# Patient Record
Sex: Female | Born: 1980 | Race: White | Hispanic: No | Marital: Single | Smoking: Current some day smoker
Health system: Southern US, Community
[De-identification: ages and names within clinical notes are randomized; demographics above are authoritative.]

## PROBLEM LIST (undated history)

## (undated) DIAGNOSIS — Z8619 Personal history of other infectious and parasitic diseases: Secondary | ICD-10-CM

## (undated) DIAGNOSIS — R51 Headache: Secondary | ICD-10-CM

## (undated) DIAGNOSIS — Z789 Other specified health status: Secondary | ICD-10-CM

## (undated) HISTORY — PX: NO PAST SURGERIES: SHX2092

## (undated) HISTORY — DX: Personal history of other infectious and parasitic diseases: Z86.19

## (undated) HISTORY — PX: MYRINGOTOMY: SUR874

---

## 2004-12-03 ENCOUNTER — Emergency Department (HOSPITAL_COMMUNITY): Admission: EM | Admit: 2004-12-03 | Discharge: 2004-12-03 | Payer: Self-pay | Admitting: Cardiology

## 2005-10-25 ENCOUNTER — Emergency Department (HOSPITAL_COMMUNITY): Admission: EM | Admit: 2005-10-25 | Discharge: 2005-10-25 | Payer: Self-pay | Admitting: Emergency Medicine

## 2007-05-22 IMAGING — CR DG FOOT COMPLETE 3+V*R*
3 series · 3 of 3 positions shown · non-contrast
Comparison: none

CLINICAL DATA: Pain and swelling laterally in foot and ankle

RIGHT FOOT - 3  VIEW

[t foot ap right]
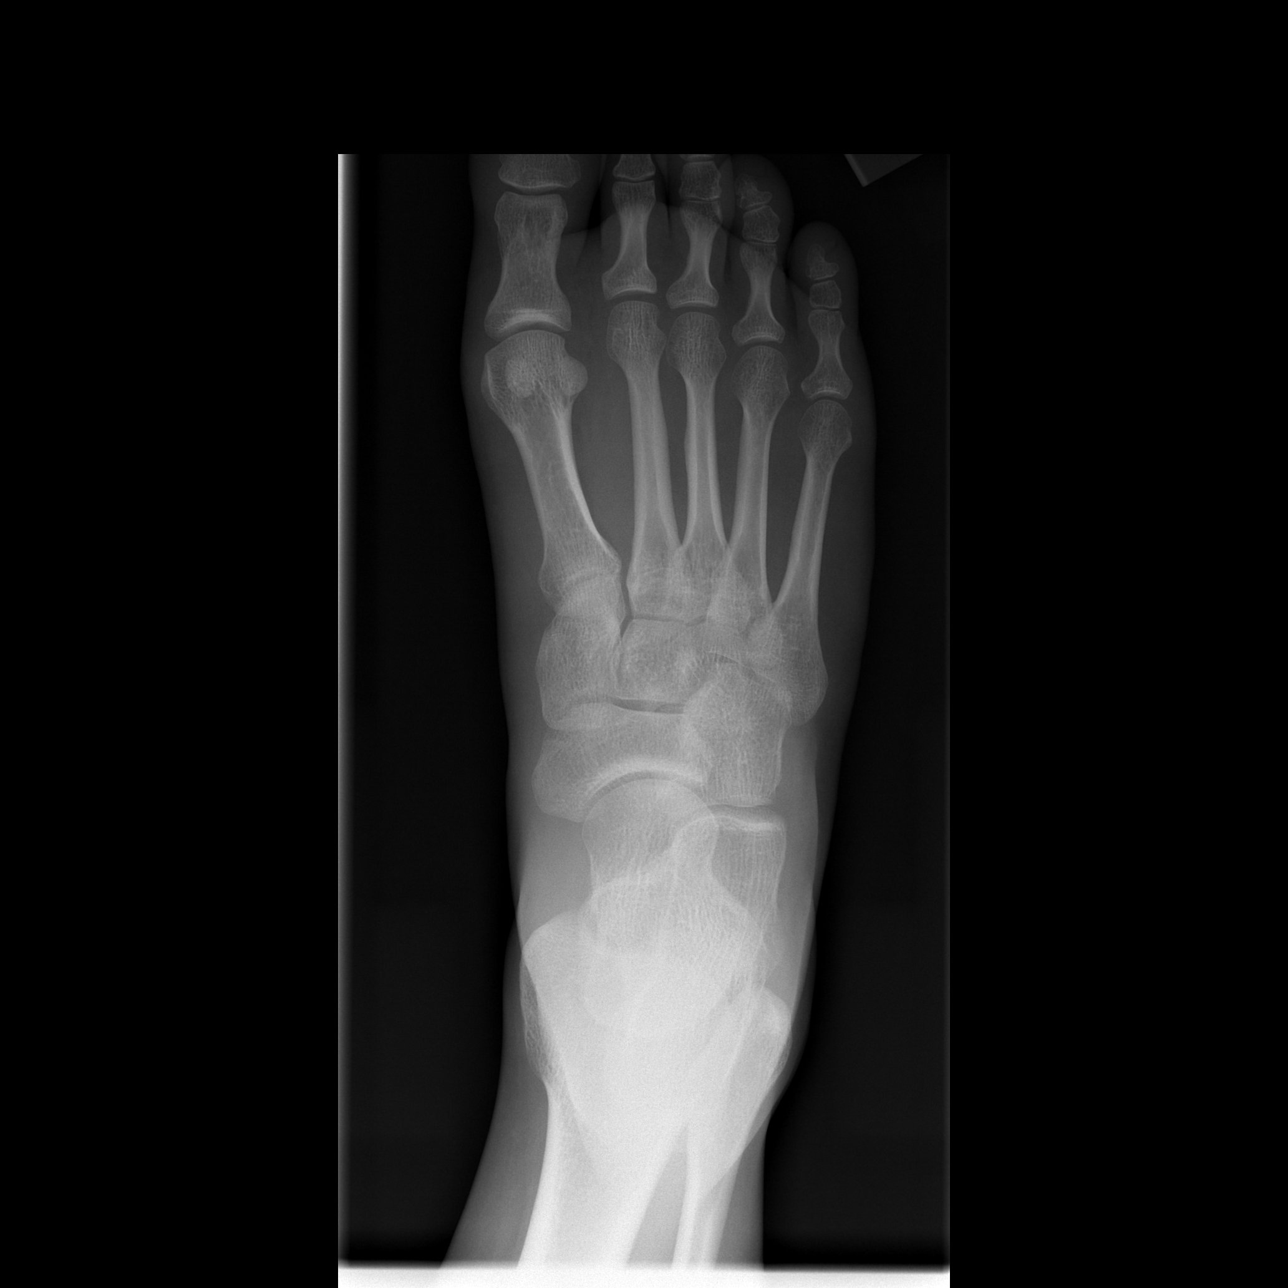

[t foot oblique right]
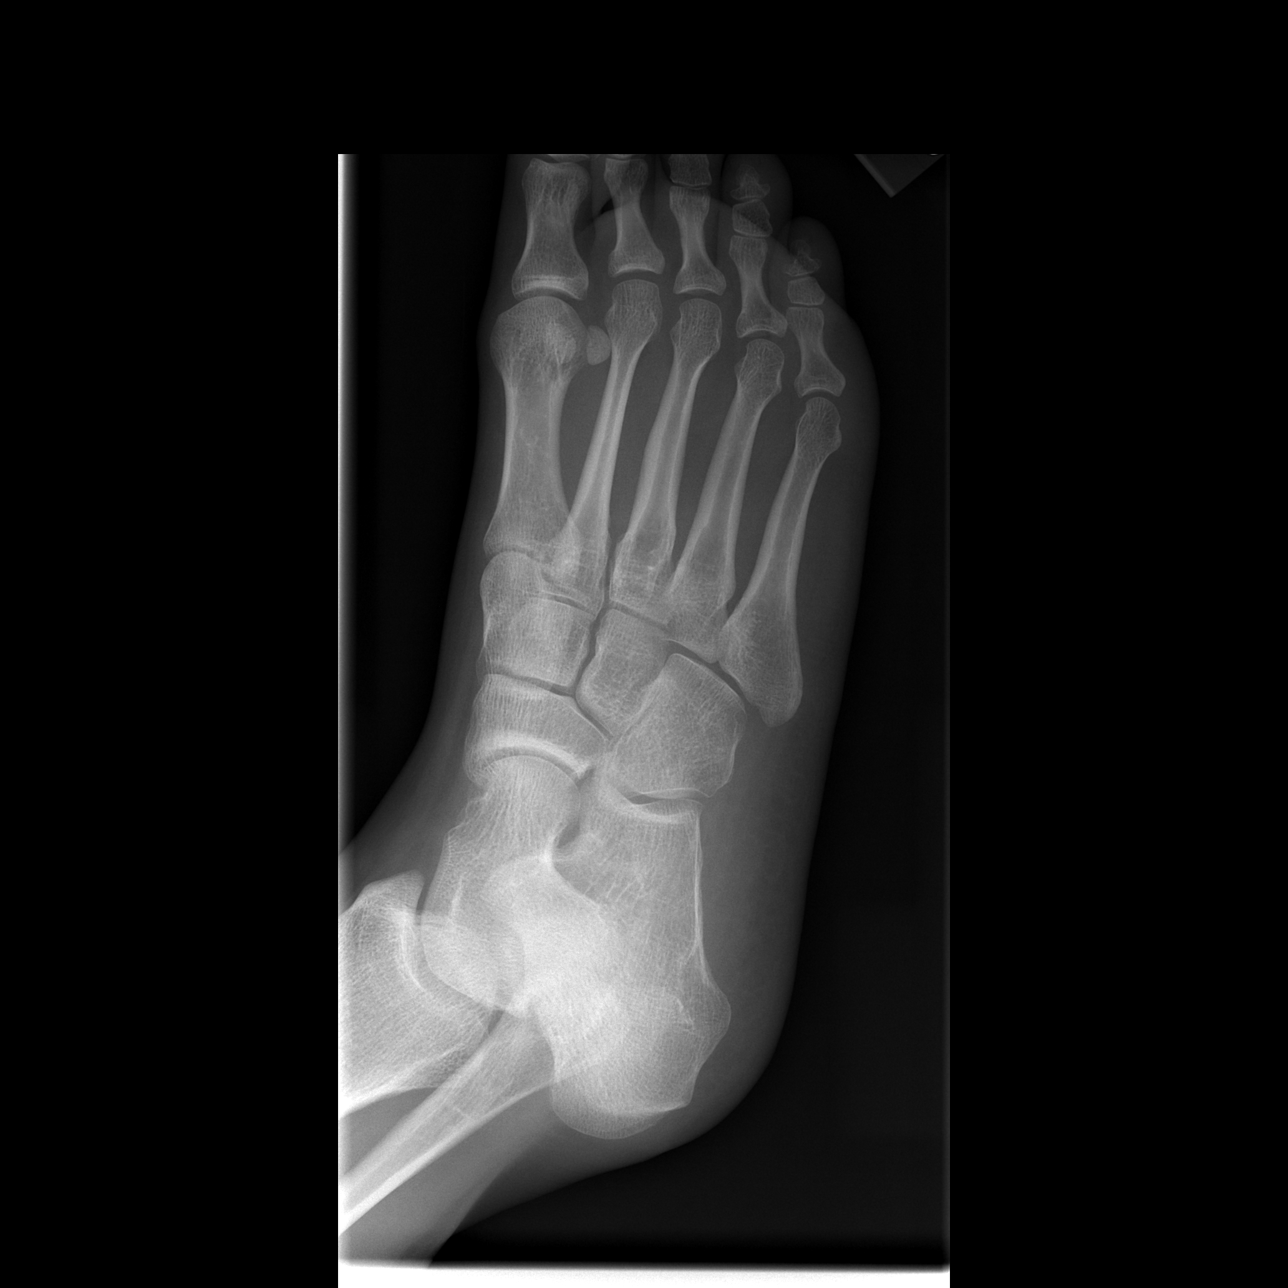

[t foot lat right]
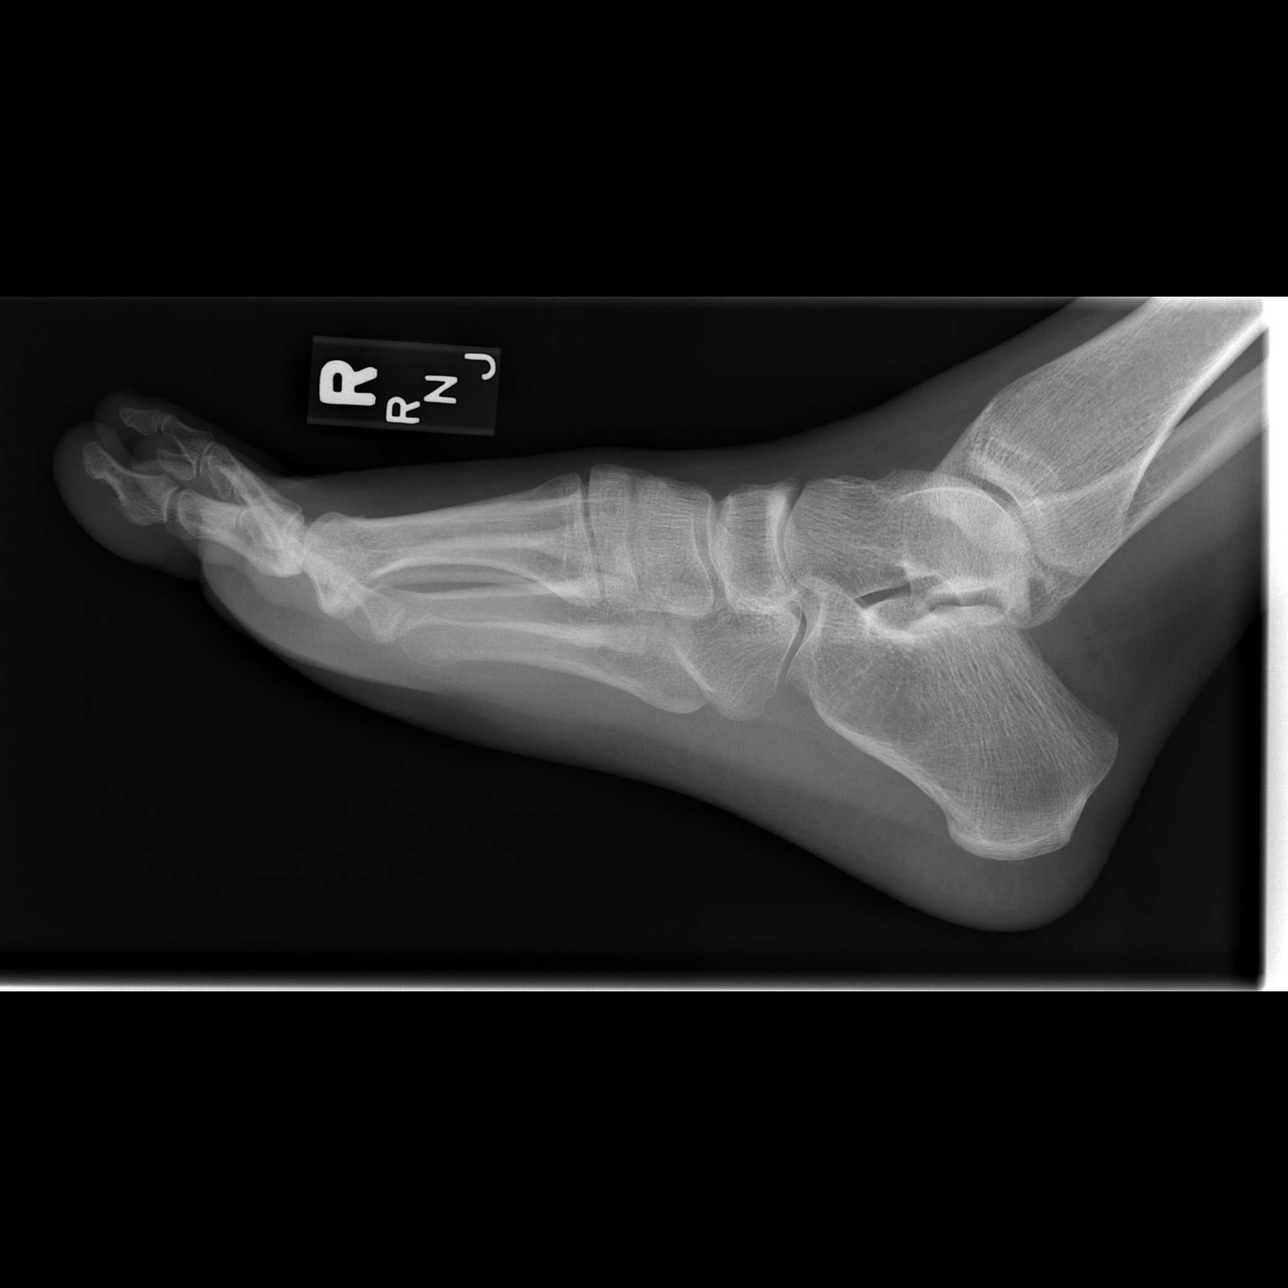

[3 of 3 positions shown; findings below may reference images not displayed]

FINDINGS: There is no evidence of fracture or dislocation.  There is no
evidence of arthropathy or other focal bone abnormality.  Soft tissues are
unremarkable.

IMPRESSION

Negative.

RIGHT ANKLE - 3  VIEW:
FINDINGS: There is no evidence of fracture, dislocation, or joint effusion. 
There is no evidence of arthropathy or other focal bone abnormality.  Soft
tissues are unremarkable.
IMPRESSION: Negative.

## 2009-05-30 ENCOUNTER — Emergency Department (HOSPITAL_COMMUNITY): Admission: EM | Admit: 2009-05-30 | Discharge: 2009-05-30 | Payer: Self-pay | Admitting: Emergency Medicine

## 2011-04-19 LAB — OB RESULTS CONSOLE RPR: RPR: NONREACTIVE

## 2011-04-19 LAB — OB RESULTS CONSOLE HIV ANTIBODY (ROUTINE TESTING): HIV: NONREACTIVE

## 2011-06-29 NOTE — L&D Delivery Note (Signed)
Delivery Note At 10:52 AM a viable female was delivered via Vaginal, Spontaneous Delivery (Presentation: Direct Occiput Anterior).  APGAR: 7, 9; weight .   Placenta status: Intact, Spontaneous.  Cord: 2 vessels with the following complications: tight nuchal cord x 1; clamped/cut prior to delivery of the shoulders.  Anesthesia: Epidural  Episiotomy: None Lacerations: 2nd degree;Labial Suture Repair: 2.0 3.0 vicryl rapide Est. Blood Loss (mL): 500 ml  Mom to postpartum.  Baby to nursery-stable.  [name],Carolyn [name] A 11/07/2011, 11:35 AM

## 2011-10-21 ENCOUNTER — Inpatient Hospital Stay (HOSPITAL_COMMUNITY): Admission: AD | Admit: 2011-10-21 | Payer: Self-pay | Source: Ambulatory Visit | Admitting: Obstetrics & Gynecology

## 2011-10-27 ENCOUNTER — Other Ambulatory Visit: Payer: Self-pay | Admitting: Obstetrics & Gynecology

## 2011-10-27 DIAGNOSIS — O480 Post-term pregnancy: Secondary | ICD-10-CM

## 2011-10-28 ENCOUNTER — Encounter (HOSPITAL_COMMUNITY): Payer: Self-pay | Admitting: *Deleted

## 2011-10-28 ENCOUNTER — Telehealth (HOSPITAL_COMMUNITY): Payer: Self-pay | Admitting: *Deleted

## 2011-10-28 ENCOUNTER — Ambulatory Visit (HOSPITAL_COMMUNITY): Payer: Self-pay

## 2011-10-28 NOTE — Telephone Encounter (Signed)
Preadmission screen  

## 2011-11-01 ENCOUNTER — Ambulatory Visit (HOSPITAL_COMMUNITY)
Admission: RE | Admit: 2011-11-01 | Discharge: 2011-11-01 | Disposition: A | Discharge status: Home / Self Care | Payer: Medicaid Other | Source: Ambulatory Visit | Attending: Obstetrics & Gynecology | Admitting: Obstetrics & Gynecology

## 2011-11-01 DIAGNOSIS — Z3689 Encounter for other specified antenatal screening: Secondary | ICD-10-CM | POA: Insufficient documentation

## 2011-11-01 DIAGNOSIS — O480 Post-term pregnancy: Secondary | ICD-10-CM | POA: Insufficient documentation

## 2011-11-03 ENCOUNTER — Encounter (HOSPITAL_COMMUNITY): Payer: Self-pay | Admitting: *Deleted

## 2011-11-03 ENCOUNTER — Telehealth (HOSPITAL_COMMUNITY): Payer: Self-pay | Admitting: *Deleted

## 2011-11-03 ENCOUNTER — Inpatient Hospital Stay (HOSPITAL_COMMUNITY): Admission: RE | Admit: 2011-11-03 | Payer: Medicaid Other | Source: Ambulatory Visit

## 2011-11-03 NOTE — Telephone Encounter (Signed)
Preadmission screen  

## 2011-11-05 ENCOUNTER — Inpatient Hospital Stay (HOSPITAL_COMMUNITY)
Admission: RE | Admit: 2011-11-05 | Discharge: 2011-11-08 | DRG: 775 | Disposition: A | Discharge status: Home / Self Care | Payer: Medicaid Other | Source: Ambulatory Visit | Attending: Obstetrics & Gynecology | Admitting: Obstetrics & Gynecology

## 2011-11-05 ENCOUNTER — Encounter (HOSPITAL_COMMUNITY): Payer: Self-pay

## 2011-11-05 DIAGNOSIS — O480 Post-term pregnancy: Secondary | ICD-10-CM | POA: Diagnosis present

## 2011-11-05 HISTORY — DX: Other specified health status: Z78.9

## 2011-11-05 LAB — CBC
HCT: 36.7 % (ref 36.0–46.0)
MCHC: 33 g/dL (ref 30.0–36.0)
MCV: 93.4 fL (ref 78.0–100.0)
RDW: 13.4 % (ref 11.5–15.5)

## 2011-11-05 MED ORDER — HYDROXYZINE HCL 50 MG/ML IM SOLN
50.0000 mg | Freq: Four times a day (QID) | INTRAMUSCULAR | Status: DC | PRN
  Filled 2011-11-05: qty 1

## 2011-11-05 MED ORDER — LACTATED RINGERS IV SOLN
500.0000 mL | INTRAVENOUS | Status: DC | PRN
  Administered 2011-11-07: 500 mL via INTRAVENOUS

## 2011-11-05 MED ORDER — IBUPROFEN 600 MG PO TABS: 600.0000 mg | ORAL_TABLET | Freq: Four times a day (QID) | ORAL | Status: DC | PRN

## 2011-11-05 MED ORDER — BUTORPHANOL TARTRATE 2 MG/ML IJ SOLN: 1.0000 mg | INTRAMUSCULAR | Status: DC | PRN

## 2011-11-05 MED ORDER — OXYCODONE-ACETAMINOPHEN 5-325 MG PO TABS: 1.0000 | ORAL_TABLET | ORAL | Status: DC | PRN

## 2011-11-05 MED ORDER — CITRIC ACID-SODIUM CITRATE 334-500 MG/5ML PO SOLN: 30.0000 mL | ORAL | Status: DC | PRN

## 2011-11-05 MED ORDER — ONDANSETRON HCL 4 MG/2ML IJ SOLN: 4.0000 mg | Freq: Four times a day (QID) | INTRAMUSCULAR | Status: DC | PRN

## 2011-11-05 MED ORDER — OXYTOCIN BOLUS FROM INFUSION
500.0000 mL | Freq: Once | INTRAVENOUS | Status: DC
  Filled 2011-11-05: qty 500

## 2011-11-05 MED ORDER — TERBUTALINE SULFATE 1 MG/ML IJ SOLN: 0.2500 mg | Freq: Once | INTRAMUSCULAR | Status: AC | PRN

## 2011-11-05 MED ORDER — LACTATED RINGERS IV SOLN
INTRAVENOUS | Status: DC
  Administered 2011-11-05 – 2011-11-07 (×3): via INTRAVENOUS

## 2011-11-05 MED ORDER — ACETAMINOPHEN 325 MG PO TABS: 650.0000 mg | ORAL_TABLET | ORAL | Status: DC | PRN

## 2011-11-05 MED ORDER — OXYTOCIN 20 UNITS IN LACTATED RINGERS INFUSION - SIMPLE: 125.0000 mL/h | Freq: Once | INTRAVENOUS | Status: DC

## 2011-11-05 MED ORDER — LIDOCAINE HCL (PF) 1 % IJ SOLN
30.0000 mL | INTRAMUSCULAR | Status: DC | PRN
  Administered 2011-11-07: 30 mL via SUBCUTANEOUS
  Filled 2011-11-05: qty 30

## 2011-11-05 MED ORDER — MISOPROSTOL 25 MCG QUARTER TABLET
25.0000 ug | ORAL_TABLET | ORAL | Status: DC | PRN
  Administered 2011-11-05: 25 ug via VAGINAL
  Filled 2011-11-05: qty 0.25
  Filled 2011-11-05: qty 1

## 2011-11-05 MED ORDER — HYDROXYZINE HCL 50 MG PO TABS
50.0000 mg | ORAL_TABLET | Freq: Four times a day (QID) | ORAL | Status: DC | PRN
  Filled 2011-11-05: qty 1

## 2011-11-06 ENCOUNTER — Inpatient Hospital Stay (HOSPITAL_COMMUNITY): Payer: Medicaid Other | Admitting: Anesthesiology

## 2011-11-06 ENCOUNTER — Encounter (HOSPITAL_COMMUNITY): Payer: Self-pay | Admitting: Anesthesiology

## 2011-11-06 DIAGNOSIS — O480 Post-term pregnancy: Secondary | ICD-10-CM | POA: Diagnosis present

## 2011-11-06 MED ORDER — PHENYLEPHRINE 40 MCG/ML (10ML) SYRINGE FOR IV PUSH (FOR BLOOD PRESSURE SUPPORT): 80.0000 ug | PREFILLED_SYRINGE | INTRAVENOUS | Status: DC | PRN

## 2011-11-06 MED ORDER — TERBUTALINE SULFATE 1 MG/ML IJ SOLN: 0.2500 mg | Freq: Once | INTRAMUSCULAR | Status: AC | PRN

## 2011-11-06 MED ORDER — FENTANYL 2.5 MCG/ML BUPIVACAINE 1/10 % EPIDURAL INFUSION (WH - ANES)
14.0000 mL/h | INTRAMUSCULAR | Status: DC
  Administered 2011-11-07 (×2): 14 mL/h via EPIDURAL
  Filled 2011-11-06 (×4): qty 60

## 2011-11-06 MED ORDER — EPHEDRINE 5 MG/ML INJ
10.0000 mg | INTRAVENOUS | Status: DC | PRN
  Filled 2011-11-06 (×2): qty 4

## 2011-11-06 MED ORDER — DIPHENHYDRAMINE HCL 50 MG/ML IJ SOLN: 12.5000 mg | INTRAMUSCULAR | Status: DC | PRN

## 2011-11-06 MED ORDER — PHENYLEPHRINE 40 MCG/ML (10ML) SYRINGE FOR IV PUSH (FOR BLOOD PRESSURE SUPPORT)
80.0000 ug | PREFILLED_SYRINGE | INTRAVENOUS | Status: DC | PRN
  Filled 2011-11-06 (×2): qty 5

## 2011-11-06 MED ORDER — OXYTOCIN 20 UNITS IN LACTATED RINGERS INFUSION - SIMPLE
1.0000 m[IU]/min | INTRAVENOUS | Status: DC
  Administered 2011-11-07: 1 m[IU]/min via INTRAVENOUS
  Administered 2011-11-07: 333 m[IU]/min via INTRAVENOUS
  Filled 2011-11-06: qty 1000

## 2011-11-06 MED ORDER — LACTATED RINGERS IV SOLN
500.0000 mL | Freq: Once | INTRAVENOUS | Status: AC
  Administered 2011-11-07: 500 mL via INTRAVENOUS

## 2011-11-06 MED ORDER — EPHEDRINE 5 MG/ML INJ: 10.0000 mg | INTRAVENOUS | Status: DC | PRN

## 2011-11-06 NOTE — Anesthesia Preprocedure Evaluation (Addendum)
Anesthesia Evaluation  Patient identified by MRN, date of birth, ID band Patient awake    Reviewed: Allergy & Precautions, H&P , Patient's Chart, lab work & pertinent test results  Airway Mallampati: III TM Distance: >3 FB Neck ROM: full    Dental No notable dental hx. (+) Teeth Intact   Pulmonary neg pulmonary ROS,  breath sounds clear to auscultation  Pulmonary exam normal       Cardiovascular negative cardio ROS  Rhythm:regular Rate:Normal     Neuro/Psych negative neurological ROS  negative psych ROS   GI/Hepatic negative GI ROS, Neg liver ROS,   Endo/Other  negative endocrine ROS  Renal/GU negative Renal ROS  negative genitourinary   Musculoskeletal   Abdominal Normal abdominal exam  (+)   Peds  Hematology negative hematology ROS (+)   Anesthesia Other Findings   Reproductive/Obstetrics (+) Pregnancy                           Anesthesia Physical Anesthesia Plan  ASA: II  Anesthesia Plan: Epidural   Post-op Pain Management:    Induction:   Airway Management Planned:   Additional Equipment:   Intra-op Plan:   Post-operative Plan:   Informed Consent: I have reviewed the patients History and Physical, chart, labs and discussed the procedure including the risks, benefits and alternatives for the proposed anesthesia with the patient or authorized representative who has indicated his/her understanding and acceptance.     Plan Discussed with: Anesthesiologist  Anesthesia Plan Comments: (After discussing epidural placement, the patient decided she did not want epidural.)       Anesthesia Quick Evaluation

## 2011-11-06 NOTE — Progress Notes (Signed)
This note also relates to the following rows which could not be included: Pulse Rate - Cannot attach notes to unvalidated device data SpO2 - Cannot attach notes to unvalidated device data Anesthesia at bedside; epidural risk and benefits given; pt decided to not get epidural at this time

## 2011-11-06 NOTE — H&P (Signed)
Carolyn [name] is a 31 y.o. female presenting for IOL. Maternal Medical History:  Reason for admission: For IOL secondary to post dates.  Fetal activity: Perceived fetal activity is normal.    Prenatal complications: Tobacco use; fetus w/single UA    OB History    Grav Para Term Preterm Abortions TAB SAB Ect Mult Living   1 0             Past Medical History  Diagnosis Date  . History of chicken pox   . No pertinent past medical history    Past Surgical History  Procedure Date  . No past surgeries    Family History: family history is negative for Anesthesia problems. Social History:  reports that she has been smoking Cigarettes.  She has a 1.3 pack-year smoking history. She has never used smokeless tobacco. She reports that she does not drink alcohol or use illicit drugs.  Review of Systems  Constitutional: Negative for fever.  Eyes: Negative for blurred vision.  Respiratory: Negative for shortness of breath.   Gastrointestinal: Negative for vomiting.  Skin: Negative for rash.  Neurological: Negative for headaches.    Dilation: 3.5 Effacement (%): 60 Station: -2;Ballotable Exam by:: Rzhang,rnc-ob (determining need for pitocin) Blood pressure 122/67, pulse 87, temperature 98 F (36.7 C), temperature source Oral, resp. rate 18, height 5\' 6"  (1.676 m), weight 74.844 kg (165 lb), last menstrual period 01/20/2011. Maternal Exam:  Abdomen: Patient reports no abdominal tenderness. Fetal presentation: vertex  Introitus: not evaluated.   Cervix: Cervix evaluated by digital exam.     Fetal Exam Fetal Monitor Review: Variability: moderate (6-25 bpm).    Fetal State Assessment: Category I - tracings are normal.     Physical Exam  Constitutional: She appears well-developed.  HENT:  Head: Normocephalic.  Neck: Neck supple. No thyromegaly present.  Cardiovascular: Normal rate and regular rhythm.   Respiratory: Breath sounds normal.  GI: Soft. Bowel sounds are normal.    Skin: No rash noted.    Prenatal labs: ABO, Rh: A/Positive/-- (10/22 0000) Antibody: Negative (10/22 0000) Rubella: Immune (10/22 0000) RPR: NON REACTIVE (05/10 2032)  HBsAg: Negative (10/22 0000)  HIV: Non-reactive (10/22 0000)  GBS: Negative (03/30 0000)   Assessment/Plan: [place] w/an IUP @ [redacted]w[redacted]d.  For IOL secondary to post dates.  Admit Two-stage IOL   [name],Justinian Miano A 11/06/2011, 9:36 AM

## 2011-11-07 ENCOUNTER — Encounter (HOSPITAL_COMMUNITY): Payer: Self-pay | Admitting: Anesthesiology

## 2011-11-07 ENCOUNTER — Encounter (HOSPITAL_COMMUNITY): Payer: Self-pay

## 2011-11-07 ENCOUNTER — Inpatient Hospital Stay (HOSPITAL_COMMUNITY): Payer: Medicaid Other | Admitting: Anesthesiology

## 2011-11-07 MED ORDER — TETANUS-DIPHTH-ACELL PERTUSSIS 5-2.5-18.5 LF-MCG/0.5 IM SUSP: 0.5000 mL | Freq: Once | INTRAMUSCULAR | Status: DC

## 2011-11-07 MED ORDER — OXYCODONE-ACETAMINOPHEN 5-325 MG PO TABS: 1.0000 | ORAL_TABLET | ORAL | Status: DC | PRN

## 2011-11-07 MED ORDER — ONDANSETRON HCL 4 MG/2ML IJ SOLN: 4.0000 mg | INTRAMUSCULAR | Status: DC | PRN

## 2011-11-07 MED ORDER — WITCH HAZEL-GLYCERIN EX PADS: 1.0000 "application " | MEDICATED_PAD | CUTANEOUS | Status: DC | PRN

## 2011-11-07 MED ORDER — SENNOSIDES-DOCUSATE SODIUM 8.6-50 MG PO TABS
2.0000 | ORAL_TABLET | Freq: Every day | ORAL | Status: DC
  Administered 2011-11-07: 2 via ORAL

## 2011-11-07 MED ORDER — MEDROXYPROGESTERONE ACETATE 150 MG/ML IM SUSP: 150.0000 mg | INTRAMUSCULAR | Status: DC | PRN

## 2011-11-07 MED ORDER — PRENATAL MULTIVITAMIN CH
1.0000 | ORAL_TABLET | Freq: Every day | ORAL | Status: DC
  Filled 2011-11-07: qty 1

## 2011-11-07 MED ORDER — LANOLIN HYDROUS EX OINT: TOPICAL_OINTMENT | CUTANEOUS | Status: DC | PRN

## 2011-11-07 MED ORDER — ZOLPIDEM TARTRATE 5 MG PO TABS: 5.0000 mg | ORAL_TABLET | Freq: Every evening | ORAL | Status: DC | PRN

## 2011-11-07 MED ORDER — ONDANSETRON HCL 4 MG PO TABS: 4.0000 mg | ORAL_TABLET | ORAL | Status: DC | PRN

## 2011-11-07 MED ORDER — FERROUS SULFATE 325 (65 FE) MG PO TABS
325.0000 mg | ORAL_TABLET | Freq: Two times a day (BID) | ORAL | Status: DC
  Administered 2011-11-07 – 2011-11-08 (×2): 325 mg via ORAL
  Filled 2011-11-07 (×2): qty 1

## 2011-11-07 MED ORDER — MEASLES, MUMPS & RUBELLA VAC ~~LOC~~ INJ
0.5000 mL | INJECTION | Freq: Once | SUBCUTANEOUS | Status: DC
  Filled 2011-11-07: qty 0.5

## 2011-11-07 MED ORDER — MAGNESIUM HYDROXIDE 400 MG/5ML PO SUSP: 30.0000 mL | ORAL | Status: DC | PRN

## 2011-11-07 MED ORDER — DIBUCAINE 1 % RE OINT: 1.0000 "application " | TOPICAL_OINTMENT | RECTAL | Status: DC | PRN

## 2011-11-07 MED ORDER — DIPHENHYDRAMINE HCL 25 MG PO CAPS: 25.0000 mg | ORAL_CAPSULE | Freq: Four times a day (QID) | ORAL | Status: DC | PRN

## 2011-11-07 MED ORDER — FENTANYL 2.5 MCG/ML BUPIVACAINE 1/10 % EPIDURAL INFUSION (WH - ANES)
INTRAMUSCULAR | Status: DC | PRN
  Administered 2011-11-07: 14 mL/h via EPIDURAL

## 2011-11-07 MED ORDER — IBUPROFEN 600 MG PO TABS
600.0000 mg | ORAL_TABLET | Freq: Four times a day (QID) | ORAL | Status: DC
  Administered 2011-11-07 – 2011-11-08 (×4): 600 mg via ORAL
  Filled 2011-11-07 (×4): qty 1

## 2011-11-07 MED ORDER — BENZOCAINE-MENTHOL 20-0.5 % EX AERO
1.0000 "application " | INHALATION_SPRAY | CUTANEOUS | Status: DC | PRN
  Filled 2011-11-07: qty 56

## 2011-11-07 MED ORDER — LIDOCAINE HCL (PF) 1 % IJ SOLN
INTRAMUSCULAR | Status: DC | PRN
  Administered 2011-11-07 (×2): 4 mL

## 2011-11-07 NOTE — Anesthesia Procedure Notes (Signed)
Epidural Patient location during procedure: OB Start time: 11/07/2011 1:53 AM  Staffing Anesthesiologist: Tomica Arseneault A. Performed by: anesthesiologist   Preanesthetic Checklist Completed: patient identified, site marked, surgical consent, pre-op evaluation, timeout performed, IV checked, risks and benefits discussed and monitors and equipment checked  Epidural Patient position: sitting Prep: site prepped and draped and DuraPrep Patient monitoring: continuous pulse ox and blood pressure Approach: midline Injection technique: LOR air  Needle:  Needle type: Tuohy  Needle gauge: 17 G Needle length: 9 cm Needle insertion depth: 4 cm Catheter type: closed end flexible Catheter size: 19 Gauge Catheter at skin depth: 9 cm Test dose: negative and Other  Assessment Events: blood not aspirated, injection not painful, no injection resistance, negative IV test and no paresthesia  Additional Notes Patient identified. Risks and benefits discussed including failed block, incomplete  Pain control, post dural puncture headache, nerve damage, paralysis, blood pressure Changes, nausea, vomiting, reactions to medications-both toxic and allergic and post Partum back pain. All questions were answered. Patient expressed understanding and wished to proceed. Sterile technique was used throughout procedure. Epidural site was Dressed with sterile barrier dressing. No paresthesias, signs of intravascular injection Or signs of intrathecal spread were encountered.  Patient was more comfortable after the epidural was dosed. Please see RN's note for documentation of vital signs and FHR which are stable.

## 2011-11-07 NOTE — Anesthesia Postprocedure Evaluation (Signed)
  Anesthesia Post-op Note  Patient: Geneticist, molecular  Procedure(s) Performed: * No procedures listed *  Patient Location: PACU and Mother/Baby  Anesthesia Type: Epidural  Level of Consciousness: awake, alert  and oriented  Airway and Oxygen Therapy: Patient Spontanous Breathing   Post-op Assessment: Patient's Cardiovascular Status Stable and Respiratory Function Stable  Post-op Vital Signs: stable  Complications: No apparent anesthesia complications

## 2011-11-07 NOTE — Progress Notes (Signed)
Carolyn [name] is a 31 y.o. G1P0 at [redacted]w[redacted]d by LMP admitted for induction of labor due to Post dates. Due date 5/1.  Subjective: Comfortable  Objective: BP 118/86  Pulse 118  Temp(Src) 98 F (36.7 C) (Oral)  Resp 18  Ht 5\' 6"  (1.676 m)  Wt 74.844 kg (165 lb)  BMI 26.63 kg/m2  SpO2 100%  LMP 01/20/2011 I/O last 3 completed shifts: In: -  Out: 350 [Urine:350]    FHT:  FHR: 140 bpm, variability: moderate,  accelerations:  Present,  decelerations:  Absent UC:   regular, every 2 minutes SVE:   Dilation: 10 Effacement (%): 90 Station: +1 Exam by:: J.Thornton, RN  Labs: Lab Results  Component Value Date   WBC 18.4* 11/05/2011   HGB 12.1 11/05/2011   HCT 36.7 11/05/2011   MCV 93.4 11/05/2011   PLT 308 11/05/2011    Assessment / Plan: Stage II; passive for now  Labor: see above Preeclampsia:  n/a Fetal Wellbeing:  Category I Pain Control:  Epidural I/D:  n/a Anticipated MOD:  NSVD  [name],Marylin Lathon A 11/07/2011, 9:13 AM

## 2011-11-07 NOTE — Progress Notes (Signed)
Maternal and fetal HR runs close together--external [place] frequently tracing maternal HR

## 2011-11-07 NOTE — Anesthesia Preprocedure Evaluation (Addendum)
Anesthesia Evaluation  Patient identified by MRN, date of birth, ID band Patient awake    Reviewed: Allergy & Precautions, H&P , Patient's Chart, lab work & pertinent test results  Airway Mallampati: III TM Distance: >3 FB Neck ROM: full    Dental No notable dental hx. (+) Teeth Intact   Pulmonary neg pulmonary ROS,  breath sounds clear to auscultation  Pulmonary exam normal       Cardiovascular negative cardio ROS  Rhythm:regular Rate:Normal     Neuro/Psych negative neurological ROS  negative psych ROS   GI/Hepatic negative GI ROS, Neg liver ROS,   Endo/Other  negative endocrine ROS  Renal/GU negative Renal ROS  negative genitourinary   Musculoskeletal   Abdominal Normal abdominal exam  (+)   Peds  Hematology negative hematology ROS (+)   Anesthesia Other Findings   Reproductive/Obstetrics (+) Pregnancy                          Anesthesia Physical  Anesthesia Plan  ASA: II  Anesthesia Plan: Epidural   Post-op Pain Management:    Induction:   Airway Management Planned:   Additional Equipment:   Intra-op Plan:   Post-operative Plan:   Informed Consent: I have reviewed the patients History and Physical, chart, labs and discussed the procedure including the risks, benefits and alternatives for the proposed anesthesia with the patient or authorized representative who has indicated his/her understanding and acceptance.     Plan Discussed with: Anesthesiologist  Anesthesia Plan Comments: (Patient now decided she wants epidural.)       Anesthesia Quick Evaluation

## 2011-11-07 NOTE — Anesthesia Postprocedure Evaluation (Signed)
  Anesthesia Post-op Note  Patient: Geneticist, molecular  Procedure(s) Performed: *Lumbar Epidural for L&D*  Patient Location: Mother/Baby  Anesthesia Type: Epidural  Level of Consciousness: awake, alert  and oriented  Airway and Oxygen Therapy: Patient Spontanous Breathing  Post-op Pain: mild  Post-op Assessment: Post-op Vital signs reviewed, Patient's Cardiovascular Status Stable, Respiratory Function Stable, Patent Airway, No signs of Nausea or vomiting, Adequate PO intake, Pain level controlled, No headache, No residual numbness and No residual motor weakness  Post-op Vital Signs: Reviewed and stable  Complications: No apparent anesthesia complications

## 2011-11-08 MED ORDER — OXYCODONE-ACETAMINOPHEN 5-325 MG PO TABS: 1.0000 | ORAL_TABLET | Freq: Four times a day (QID) | ORAL | Status: AC | PRN

## 2011-11-08 MED ORDER — NORETHINDRONE 0.35 MG PO TABS: 1.0000 | ORAL_TABLET | Freq: Every day | ORAL | Status: DC

## 2011-11-08 NOTE — Discharge Instructions (Signed)

## 2011-11-08 NOTE — Discharge Summary (Signed)
Obstetric Discharge Summary Reason for Admission: induction of labor Prenatal Procedures: none Intrapartum Procedures: spontaneous vaginal delivery Postpartum Procedures: none Complications-Operative and Postpartum: 2 degree perineal laceration Hemoglobin  Date Value Range Status  11/08/2011 9.9* 12.0-15.0 (g/dL) Final     HCT  Date Value Range Status  11/08/2011 30.1* 36.0-46.0 (%) Final    Physical Exam:  General: alert Lochia: appropriate Uterine Fundus: firm Incision: n/a DVT Evaluation: No evidence of DVT seen on physical exam.  Discharge Diagnoses: Term Pregnancy-delivered  Discharge Information: Date: 11/08/2011 Activity: pelvic rest Diet: routine Medications: PNV, Percocet and Micronor Condition: stable Instructions: see above Discharge to: home Follow-up Information    Follow up with Antionette Char A, MD. Schedule an appointment as soon as possible for a visit in 6 weeks.   Contact information:   [address] [place] [address] [phone]          Newborn Data: Live born female  Birth Weight: 7 lb 0.4 oz (3187 g) APGAR: 7, 9  Home with mother.  [name],Candita Borenstein A 11/08/2011, 8:21 AM

## 2011-11-08 NOTE — Progress Notes (Signed)
UR chart review completed.  

## 2011-11-12 ENCOUNTER — Encounter (HOSPITAL_COMMUNITY): Payer: Self-pay | Admitting: *Deleted

## 2011-11-12 ENCOUNTER — Inpatient Hospital Stay (HOSPITAL_COMMUNITY)
Admission: AD | Admit: 2011-11-12 | Discharge: 2011-11-12 | Disposition: A | Discharge status: Home / Self Care | Payer: Medicaid Other | Source: Ambulatory Visit | Attending: Obstetrics | Admitting: Obstetrics

## 2011-11-12 DIAGNOSIS — IMO0001 Reserved for inherently not codable concepts without codable children: Secondary | ICD-10-CM

## 2011-11-12 DIAGNOSIS — O99893 Other specified diseases and conditions complicating puerperium: Secondary | ICD-10-CM | POA: Insufficient documentation

## 2011-11-12 DIAGNOSIS — R1011 Right upper quadrant pain: Secondary | ICD-10-CM | POA: Insufficient documentation

## 2011-11-12 DIAGNOSIS — R7402 Elevation of levels of lactic acid dehydrogenase (LDH): Secondary | ICD-10-CM | POA: Insufficient documentation

## 2011-11-12 DIAGNOSIS — R7401 Elevation of levels of liver transaminase levels: Secondary | ICD-10-CM | POA: Insufficient documentation

## 2011-11-12 DIAGNOSIS — M7918 Myalgia, other site: Secondary | ICD-10-CM

## 2011-11-12 LAB — COMPREHENSIVE METABOLIC PANEL
ALT: 26 U/L (ref 0–35)
AST: 28 U/L (ref 0–37)
Alkaline Phosphatase: 109 U/L (ref 39–117)
CO2: 23 mEq/L (ref 19–32)
Calcium: 10.5 mg/dL (ref 8.4–10.5)
Chloride: 102 mEq/L (ref 96–112)
GFR calc Af Amer: >90 mL/min (ref >90)
GFR calc non Af Amer: >90 mL/min (ref >90)
Glucose, Bld: 77 mg/dL (ref 70–99)
Sodium: 139 mEq/L (ref 135–145)
Total Bilirubin: 0.3 mg/dL (ref 0.3–1.2)

## 2011-11-12 LAB — URINALYSIS, ROUTINE W REFLEX MICROSCOPIC
Bilirubin Urine: NEGATIVE
Glucose, UA: NEGATIVE mg/dL
Ketones, ur: NEGATIVE mg/dL
Protein, ur: NEGATIVE mg/dL
pH: 7 (ref 5.0–8.0)

## 2011-11-12 LAB — CBC
Hemoglobin: 12.7 g/dL (ref 12.0–15.0)
MCH: 30.5 pg (ref 26.0–34.0)
RBC: 4.17 MIL/uL (ref 3.87–5.11)
WBC: 13 10*3/uL — ABNORMAL HIGH (ref 4.0–10.5)

## 2011-11-12 LAB — URINE MICROSCOPIC-ADD ON

## 2011-11-12 MED ORDER — KETOROLAC TROMETHAMINE 60 MG/2ML IM SOLN
60.0000 mg | Freq: Once | INTRAMUSCULAR | Status: DC
  Filled 2011-11-12: qty 2

## 2011-11-12 MED ORDER — GI COCKTAIL ~~LOC~~
30.0000 mL | Freq: Once | ORAL | Status: AC
  Administered 2011-11-12: 30 mL via ORAL
  Filled 2011-11-12: qty 30

## 2011-11-12 NOTE — MAU Provider Note (Signed)
[employer] [id number] 4 days postpartum SVD. Chief Complaint  Patient presents with  . Abdominal Pain     First Provider Initiated Contact with Patient 11/12/11 1915      SUBJECTIVE  HPI: 5 days postpartum SVD reporting 2 day Hx of RUQ pain rated 1-6/10 on pain scale. Worse w/ mvmt. "Makes it feel a little bit hard to breathe." Denies HA, visions changes, increased edema, SOB, hemoptysis, calf pain or swelling, N/V, fever, chills. No relationship to eating. Has not tried any Tx.  Past Medical History  Diagnosis Date  . History of chicken pox   . No pertinent past medical history    Past Surgical History  Procedure Date  . No past surgeries    History   Social History  . Marital Status: Single    Spouse Name: N/A    Number of Children: N/A  . Years of Education: N/A   Occupational History  . Not on file.   Social History Main Topics  . Smoking status: Current Some Day Smoker -- 0.1 packs/day for 13 years    Types: Cigarettes  . Smokeless tobacco: Never Used  . Alcohol Use: No  . Drug Use: No  . Sexually Active: Yes    Birth Control/ Protection: Pill   Other Topics Concern  . Not on file   Social History Narrative  . No narrative on file   No current facility-administered medications on file prior to encounter.   Current Outpatient Prescriptions on File Prior to Encounter  Medication Sig Dispense Refill  . Prenatal Vit-Fe Fumarate-FA (PRENATAL MULTIVITAMIN) TABS Take 1 tablet by mouth every morning.      . norethindrone (ORTHO MICRONOR) 0.35 MG tablet Take 1 tablet (0.35 mg total) by mouth daily.  28 tablet  11  . oxyCODONE-acetaminophen (PERCOCET) 5-325 MG per tablet Take 1-2 tablets by mouth every 6 (six) hours as needed (moderate - severe pain).  30 tablet  0   Allergies  Allergen Reactions  . Aspirin Other (See Comments)    Unknown, pt states she does not take this as precaution.  Marland Kitchen Penicillins Nausea And Vomiting    ROS: Pertinent items in  HPI  OBJECTIVE Blood pressure 130/94, pulse 65, temperature 98.9 F (37.2 C), temperature source Oral, resp. rate 16, last menstrual period 01/20/2011, SpO2 100.00%, currently breastfeeding. BPs 120-130s/60-80s  GENERAL: Well-developed, well-nourished female in no acute distress.  HEENT: Normocephalic HEART: Regular rate and rhythm. No M/R/G RESP: CTAB, normal effort.  ABDOMEN: Soft, nontender. Fundus firm, 3/U. No epigastric. Mild RUQ tenderness  EXTREMITIES: Nontender, no edema. DTRs 2+ NEURO: Alert and oriented SPECULUM EXAM: Deferred  LAB RESULTS Results for orders placed during the hospital encounter of 11/12/11 (from the past 72 hour(s))  URINALYSIS, ROUTINE W REFLEX MICROSCOPIC     Status: Abnormal   Collection Time   11/12/11  6:05 PM      Component Value Range Comment   Color, Urine YELLOW  YELLOW     APPearance CLOUDY (*) CLEAR     Specific Gravity, Urine 1.010  1.005 - 1.030     pH 7.0  5.0 - 8.0     Glucose, UA NEGATIVE  NEGATIVE (mg/dL)    Hgb urine dipstick LARGE (*) NEGATIVE     Bilirubin Urine NEGATIVE  NEGATIVE     Ketones, ur NEGATIVE  NEGATIVE (mg/dL)    Protein, ur NEGATIVE  NEGATIVE (mg/dL)    Urobilinogen, UA 0.2  0.0 - 1.0 (mg/dL)    Nitrite NEGATIVE  NEGATIVE     Leukocytes, UA LARGE (*) NEGATIVE    URINE MICROSCOPIC-ADD ON     Status: Abnormal   Collection Time   11/12/11  6:05 PM      Component Value Range Comment   Squamous Epithelial / LPF FEW (*) RARE     WBC, UA 21-50  <3 (WBC/hpf)    Bacteria, UA FEW (*) RARE    CBC     Status: Abnormal   Collection Time   11/12/11  7:30 PM      Component Value Range Comment   WBC 13.0 (*) 4.0 - 10.5 (K/uL)    RBC 4.17  3.87 - 5.11 (MIL/uL)    Hemoglobin 12.7  12.0 - 15.0 (g/dL)    HCT 78.2  95.6 - 21.3 (%)    MCV 92.3  78.0 - 100.0 (fL)    MCH 30.5  26.0 - 34.0 (pg)    MCHC 33.0  30.0 - 36.0 (g/dL)    RDW 08.6  57.8 - 46.9 (%)    Platelets 340  150 - 400 (K/uL)   COMPREHENSIVE METABOLIC PANEL      Status: Abnormal   Collection Time   11/12/11  7:30 PM      Component Value Range Comment   Sodium 139  135 - 145 (mEq/L)    Potassium 4.5  3.5 - 5.1 (mEq/L) HEMOLYSIS AT THIS LEVEL MAY AFFECT RESULT   Chloride 102  96 - 112 (mEq/L)    CO2 23  19 - 32 (mEq/L)    Glucose, Bld 77  70 - 99 (mg/dL)    BUN 11  6 - 23 (mg/dL)    Creatinine, Ser 6.29  0.50 - 1.10 (mg/dL)    Calcium 52.8  8.4 - 10.5 (mg/dL)    Total Protein 6.9  6.0 - 8.3 (g/dL)    Albumin 3.0 (*) 3.5 - 5.2 (g/dL)    AST 28  0 - 37 (U/L) HEMOLYSIS AT THIS LEVEL MAY AFFECT RESULT   ALT 26  0 - 35 (U/L)    Alkaline Phosphatase 109  39 - 117 (U/L)    Total Bilirubin 0.3  0.3 - 1.2 (mg/dL)    GFR calc non Af Amer >90  >90 (mL/min)    GFR calc Af Amer >90  >90 (mL/min)   URIC ACID     Status: Normal   Collection Time   11/12/11  7:30 PM      Component Value Range Comment   Uric Acid, Serum 5.9  2.4 - 7.0 (mg/dL)   LACTATE DEHYDROGENASE     Status: Abnormal   Collection Time   11/12/11  7:30 PM      Component Value Range Comment   LDH 406 (*) 94 - 250 (U/L) HEMOLYSIS AT THIS LEVEL MAY AFFECT RESULT   Mild improvement w/ GI cocktail. Offered Toradol per consult / Dr, Clearance Coots, declined. Pain 2/10 at time of D/C.  IMAGING   ASSESSMENT 1. Musculoskeletal pain   2. RUQ pain   3. Elevated LDH        of unknown etiology  PLAN D/C home per consult w/ Dr. Clearance Coots. Ibuprofen, percocet PRN   Medication List  As of 11/15/2011  2:57 PM   CONTINUE taking these medications         ibuprofen 200 MG tablet   Commonly known as: ADVIL,MOTRIN      norethindrone 0.35 MG tablet   Commonly known as: MICRONOR,CAMILA,ERRIN   Take 1 tablet (0.35 mg total)  by mouth daily.      oxyCODONE-acetaminophen 5-325 MG per tablet   Commonly known as: PERCOCET   Take 1-2 tablets by mouth every 6 (six) hours as needed (moderate - severe pain).      prenatal multivitamin Tabs           Follow-up Information    Follow up with HARPER,CHARLES A,  MD in 1 week. (or MAU as needed if symptoms worsen)    Contact information:   [address] [place] [address] [phone]        [facility] precautions  Dorathy Kinsman 11/12/2011 7:29 PM

## 2011-11-12 NOTE — MAU Note (Signed)
Patient states she had a SVD on 5-12 and went home on 5-13. On 5-14 started to have epigastric pain that has continued and getting worse.

## 2011-11-18 ENCOUNTER — Other Ambulatory Visit: Payer: Self-pay | Admitting: Obstetrics & Gynecology

## 2011-11-18 DIAGNOSIS — K8020 Calculus of gallbladder without cholecystitis without obstruction: Secondary | ICD-10-CM

## 2011-11-19 ENCOUNTER — Ambulatory Visit (HOSPITAL_COMMUNITY)
Admission: RE | Admit: 2011-11-19 | Discharge: 2011-11-19 | Disposition: A | Discharge status: Home / Self Care | Payer: Medicaid Other | Source: Ambulatory Visit | Attending: Obstetrics & Gynecology | Admitting: Obstetrics & Gynecology

## 2011-11-19 DIAGNOSIS — R109 Unspecified abdominal pain: Secondary | ICD-10-CM | POA: Insufficient documentation

## 2011-11-19 DIAGNOSIS — O99893 Other specified diseases and conditions complicating puerperium: Secondary | ICD-10-CM | POA: Insufficient documentation

## 2011-11-19 DIAGNOSIS — R7401 Elevation of levels of liver transaminase levels: Secondary | ICD-10-CM | POA: Insufficient documentation

## 2011-11-19 DIAGNOSIS — K862 Cyst of pancreas: Secondary | ICD-10-CM | POA: Insufficient documentation

## 2011-11-19 DIAGNOSIS — R7402 Elevation of levels of lactic acid dehydrogenase (LDH): Secondary | ICD-10-CM | POA: Insufficient documentation

## 2011-11-19 DIAGNOSIS — K8020 Calculus of gallbladder without cholecystitis without obstruction: Secondary | ICD-10-CM

## 2012-06-28 NOTE — L&D Delivery Note (Signed)
Delivery Note At 11:42 PM a viable female was delivered via Vaginal, Spontaneous Delivery (Presentation: Vertex ;  ).  APGAR: 8, 9; weight: 2835 gms..   Placenta status: Intact, Spontaneous.  Cord: 3 vessels with the following complications: None.  Cord pH: none  Anesthesia: Epidural  Episiotomy: None Lacerations: 2nd degree;Perineal Suture Repair: 2.0 chromic Est. Blood Loss (mL): 300  Mom to postpartum.  Baby to nursery-stable.  Amneet Cendejas A 01/31/2013, 12:37 AM

## 2012-08-24 LAB — OB RESULTS CONSOLE HGB/HCT, BLOOD: Hemoglobin: 13.2 g/dL

## 2012-08-24 LAB — OB RESULTS CONSOLE GC/CHLAMYDIA
Chlamydia: NEGATIVE
Gonorrhea: NEGATIVE

## 2012-08-24 LAB — OB RESULTS CONSOLE HIV ANTIBODY (ROUTINE TESTING): HIV: NONREACTIVE

## 2012-09-29 ENCOUNTER — Encounter: Payer: Self-pay | Admitting: *Deleted

## 2012-10-05 ENCOUNTER — Encounter: Payer: Self-pay | Admitting: Obstetrics & Gynecology

## 2012-10-05 ENCOUNTER — Ambulatory Visit (INDEPENDENT_AMBULATORY_CARE_PROVIDER_SITE_OTHER): Payer: Medicaid Other | Admitting: Obstetrics & Gynecology

## 2012-10-05 VITALS — BP 115/76 | Temp 97.8°F | Wt 156.0 lb

## 2012-10-05 DIAGNOSIS — E162 Hypoglycemia, unspecified: Secondary | ICD-10-CM

## 2012-10-05 DIAGNOSIS — Z3480 Encounter for supervision of other normal pregnancy, unspecified trimester: Secondary | ICD-10-CM

## 2012-10-05 DIAGNOSIS — F17200 Nicotine dependence, unspecified, uncomplicated: Secondary | ICD-10-CM | POA: Insufficient documentation

## 2012-10-05 DIAGNOSIS — A499 Bacterial infection, unspecified: Secondary | ICD-10-CM

## 2012-10-05 DIAGNOSIS — Z3482 Encounter for supervision of other normal pregnancy, second trimester: Secondary | ICD-10-CM

## 2012-10-05 DIAGNOSIS — N760 Acute vaginitis: Secondary | ICD-10-CM

## 2012-10-05 DIAGNOSIS — B9689 Other specified bacterial agents as the cause of diseases classified elsewhere: Secondary | ICD-10-CM

## 2012-10-05 LAB — POCT URINALYSIS DIPSTICK
Bilirubin, UA: NEGATIVE
Ketones, UA: NEGATIVE
Spec Grav, UA: <=1.005
pH, UA: 5

## 2012-10-05 MED ORDER — OB COMPLETE PETITE 35-5-1-200 MG PO CAPS: 1.0000 | ORAL_CAPSULE | Freq: Every day | ORAL | Status: DC

## 2012-10-05 MED ORDER — METRONIDAZOLE 500 MG PO TABS: 500.0000 mg | ORAL_TABLET | Freq: Two times a day (BID) | ORAL | Status: DC

## 2012-10-05 NOTE — Progress Notes (Signed)
Pulse: 89

## 2012-10-05 NOTE — Patient Instructions (Signed)

## 2012-10-05 NOTE — Progress Notes (Signed)
Doing well 

## 2012-11-02 ENCOUNTER — Encounter: Payer: Self-pay | Admitting: Obstetrics & Gynecology

## 2012-11-02 ENCOUNTER — Ambulatory Visit (INDEPENDENT_AMBULATORY_CARE_PROVIDER_SITE_OTHER): Payer: Medicaid Other | Admitting: Obstetrics & Gynecology

## 2012-11-02 VITALS — BP 131/81 | Temp 99.2°F | Wt 160.0 lb

## 2012-11-02 DIAGNOSIS — Z3480 Encounter for supervision of other normal pregnancy, unspecified trimester: Secondary | ICD-10-CM

## 2012-11-02 DIAGNOSIS — Z3482 Encounter for supervision of other normal pregnancy, second trimester: Secondary | ICD-10-CM

## 2012-11-02 LAB — POCT URINALYSIS DIPSTICK
Bilirubin, UA: NEGATIVE
Blood, UA: NEGATIVE
Glucose, UA: NEGATIVE
Ketones, UA: NEGATIVE
Protein, UA: NEGATIVE
Urobilinogen, UA: NEGATIVE

## 2012-11-02 NOTE — Patient Instructions (Signed)
Pregnancy - Third Trimester  The third trimester of pregnancy (the last 3 months) is a period of the most rapid growth for you and your baby. The baby approaches a length of 20 inches and a weight of 6 to 10 pounds. The baby is adding on fat and getting ready for life outside your body. While inside, babies have periods of sleeping and waking, suck their thumbs, and hiccups. You can often feel small contractions of the uterus. This is false labor. It is also called Braxton-Hicks contractions. This is like a practice for labor. The usual problems in this stage of pregnancy include more difficulty breathing, swelling of the hands and feet from water retention, and having to urinate more often because of the uterus and baby pressing on your bladder.   PRENATAL EXAMS  · Blood work may continue to be done during prenatal exams. These tests are done to check on your health and the probable health of your baby. Blood work is used to follow your blood levels (hemoglobin). Anemia (low hemoglobin) is common during pregnancy. Iron and vitamins are given to help prevent this. You may also continue to be checked for diabetes. Some of the past blood tests may be done again.  · The size of the uterus is measured during each visit. This makes sure your baby is growing properly according to your pregnancy dates.  · Your blood pressure is checked every prenatal visit. This is to make sure you are not getting toxemia.  · Your urine is checked every prenatal visit for infection, diabetes and protein.  · Your weight is checked at each visit. This is done to make sure gains are happening at the suggested rate and that you and your baby are growing normally.  · Sometimes, an ultrasound is performed to confirm the position and the proper growth and development of the baby. This is a test done that bounces harmless sound waves off the baby so your caregiver can more accurately determine due dates.  · Discuss the type of pain medication and  anesthesia you will have during your labor and delivery.  · Discuss the possibility and anesthesia if a Cesarean Section might be necessary.  · Inform your caregiver if there is any mental or physical violence at home.  Sometimes, a specialized non-stress test, contraction stress test and biophysical profile are done to make sure the baby is not having a problem. Checking the amniotic fluid surrounding the baby is called an amniocentesis. The amniotic fluid is removed by sticking a needle into the belly (abdomen). This is sometimes done near the end of pregnancy if an early delivery is required. In this case, it is done to help make sure the baby's lungs are mature enough for the baby to live outside of the womb. If the lungs are not mature and it is unsafe to deliver the baby, an injection of cortisone medication is given to the mother 1 to 2 days before the delivery. This helps the baby's lungs mature and makes it safer to deliver the baby.  CHANGES OCCURING IN THE THIRD TRIMESTER OF PREGNANCY  Your body goes through many changes during pregnancy. They vary from person to person. Talk to your caregiver about changes you notice and are concerned about.  · During the last trimester, you have probably had an increase in your appetite. It is normal to have cravings for certain foods. This varies from person to person and pregnancy to pregnancy.  · You may begin to   get stretch marks on your hips, abdomen, and breasts. These are normal changes in the body during pregnancy. There are no exercises or medications to take which prevent this change.  · Constipation may be treated with a stool softener or adding bulk to your diet. Drinking lots of fluids, fiber in vegetables, fruits, and whole grains are helpful.  · Exercising is also helpful. If you have been very active up until your pregnancy, most of these activities can be continued during your pregnancy. If you have been less active, it is helpful to start an exercise  program such as walking. Consult your caregiver before starting exercise programs.  · Avoid all smoking, alcohol, un-prescribed drugs, herbs and "street drugs" during your pregnancy. These chemicals affect the formation and growth of the baby. Avoid chemicals throughout the pregnancy to ensure the delivery of a healthy infant.  · Backache, varicose veins and hemorrhoids may develop or get worse.  · You will tire more easily in the third trimester, which is normal.  · The baby's movements may be stronger and more often.  · You may become short of breath easily.  · Your belly button may stick out.  · A yellow discharge may leak from your breasts called colostrum.  · You may have a bloody mucus discharge. This usually occurs a few days to a week before labor begins.  HOME CARE INSTRUCTIONS   · Keep your caregiver's appointments. Follow your caregiver's instructions regarding medication use, exercise, and diet.  · During pregnancy, you are providing food for you and your baby. Continue to eat regular, well-balanced meals. Choose foods such as meat, fish, milk and other low fat dairy products, vegetables, fruits, and whole-grain breads and cereals. Your caregiver will tell you of the ideal weight gain.  · A physical sexual relationship may be continued throughout pregnancy if there are no other problems such as early (premature) leaking of amniotic fluid from the membranes, vaginal bleeding, or belly (abdominal) pain.  · Exercise regularly if there are no restrictions. Check with your caregiver if you are unsure of the safety of your exercises. Greater weight gain will occur in the last 2 trimesters of pregnancy. Exercising helps:  · Control your weight.  · Get you in shape for labor and delivery.  · You lose weight after you deliver.  · Rest a lot with legs elevated, or as needed for leg cramps or low back pain.  · Wear a good support or jogging bra for breast tenderness during pregnancy. This may help if worn during  sleep. Pads or tissues may be used in the bra if you are leaking colostrum.  · Do not use hot tubs, steam rooms, or saunas.  · Wear your seat belt when driving. This protects you and your baby if you are in an accident.  · Avoid raw meat, cat litter boxes and soil used by cats. These carry germs that can cause birth defects in the baby.  · It is easier to loose urine during pregnancy. Tightening up and strengthening the pelvic muscles will help with this problem. You can practice stopping your urination while you are going to the bathroom. These are the same muscles you need to strengthen. It is also the muscles you would use if you were trying to stop from passing gas. You can practice tightening these muscles up 10 times a set and repeating this about 3 times per day. Once you know what muscles to tighten up, do not perform these   exercises during urination. It is more likely to cause an infection by backing up the urine.  · Ask for help if you have financial, counseling or nutritional needs during pregnancy. Your caregiver will be able to offer counseling for these needs as well as refer you for other special needs.  · Make a list of emergency phone numbers and have them available.  · Plan on getting help from family or friends when you go home from the hospital.  · Make a trial run to the hospital.  · Take prenatal classes with the father to understand, practice and ask questions about the labor and delivery.  · Prepare the baby's room/nursery.  · Do not travel out of the city unless it is absolutely necessary and with the advice of your caregiver.  · Wear only low or no heal shoes to have better balance and prevent falling.  MEDICATIONS AND DRUG USE IN PREGNANCY  · Take prenatal vitamins as directed. The vitamin should contain 1 milligram of folic acid. Keep all vitamins out of reach of children. Only a couple vitamins or tablets containing iron may be fatal to a baby or young child when ingested.  · Avoid use  of all medications, including herbs, over-the-counter medications, not prescribed or suggested by your caregiver. Only take over-the-counter or prescription medicines for pain, discomfort, or fever as directed by your caregiver. Do not use aspirin, ibuprofen (Motrin®, Advil®, Nuprin®) or naproxen (Aleve®) unless OK'd by your caregiver.  · Let your caregiver also know about herbs you may be using.  · Alcohol is related to a number of birth defects. This includes fetal alcohol syndrome. All alcohol, in any form, should be avoided completely. Smoking will cause low birth rate and premature babies.  · Street/illegal drugs are very harmful to the baby. They are absolutely forbidden. A baby born to an addicted mother will be addicted at birth. The baby will go through the same withdrawal an adult does.  SEEK MEDICAL CARE IF:  You have any concerns or worries during your pregnancy. It is better to call with your questions if you feel they cannot wait, rather than worry about them.  DECISIONS ABOUT CIRCUMCISION  You may or may not know the sex of your baby. If you know your baby is a boy, it may be time to think about circumcision. Circumcision is the removal of the foreskin of the penis. This is the skin that covers the sensitive end of the penis. There is no proven medical need for this. Often this decision is made on what is popular at the time or based upon religious beliefs and social issues. You can discuss these issues with your caregiver or pediatrician.  SEEK IMMEDIATE MEDICAL CARE IF:   · An unexplained oral temperature above 102° F (38.9° C) develops, or as your caregiver suggests.  · You have leaking of fluid from the vagina (birth canal). If leaking membranes are suspected, take your temperature and tell your caregiver of this when you call.  · There is vaginal spotting, bleeding or passing clots. Tell your caregiver of the amount and how many pads are used.  · You develop a bad smelling vaginal discharge with  a change in the color from clear to white.  · You develop vomiting that lasts more than 24 hours.  · You develop chills or fever.  · You develop shortness of breath.  · You develop burning on urination.  · You loose more than 2 pounds of weight   or gain more than 2 pounds of weight or as suggested by your caregiver.  · You notice sudden swelling of your face, hands, and feet or legs.  · You develop belly (abdominal) pain. Round ligament discomfort is a common non-cancerous (benign) cause of abdominal pain in pregnancy. Your caregiver still must evaluate you.  · You develop a severe headache that does not go away.  · You develop visual problems, blurred or double vision.  · If you have not felt your baby move for more than 1 hour. If you think the baby is not moving as much as usual, eat something with sugar in it and lie down on your left side for an hour. The baby should move at least 4 to 5 times per hour. Call right away if your baby moves less than that.  · You fall, are in a car accident or any kind of trauma.  · There is mental or physical violence at home.  Document Released: 06/08/2001 Document Revised: 09/06/2011 Document Reviewed: 12/11/2008  Exit[employer]® Patient Information ©2013 [employer].

## 2012-11-02 NOTE — Progress Notes (Signed)
p 128 Patient reports she is doing well today- pt reports she is hypoglycemic and does not do glucose testing

## 2012-11-02 NOTE — Progress Notes (Signed)
Doing well 

## 2012-11-16 ENCOUNTER — Encounter: Payer: Medicaid Other | Admitting: Obstetrics & Gynecology

## 2012-11-30 ENCOUNTER — Encounter: Payer: Medicaid Other | Admitting: Obstetrics & Gynecology

## 2012-12-11 ENCOUNTER — Encounter: Payer: Self-pay | Admitting: Obstetrics & Gynecology

## 2012-12-11 ENCOUNTER — Ambulatory Visit (INDEPENDENT_AMBULATORY_CARE_PROVIDER_SITE_OTHER): Payer: Medicaid Other | Admitting: Obstetrics & Gynecology

## 2012-12-11 VITALS — BP 130/84 | Temp 98.0°F | Wt 165.0 lb

## 2012-12-11 DIAGNOSIS — F411 Generalized anxiety disorder: Secondary | ICD-10-CM

## 2012-12-11 DIAGNOSIS — F419 Anxiety disorder, unspecified: Secondary | ICD-10-CM | POA: Insufficient documentation

## 2012-12-11 DIAGNOSIS — Z3480 Encounter for supervision of other normal pregnancy, unspecified trimester: Secondary | ICD-10-CM

## 2012-12-11 DIAGNOSIS — Z3483 Encounter for supervision of other normal pregnancy, third trimester: Secondary | ICD-10-CM

## 2012-12-11 LAB — POCT URINALYSIS DIPSTICK
Blood, UA: NEGATIVE
Ketones, UA: NEGATIVE
Spec Grav, UA: 1.01
pH, UA: 7

## 2012-12-11 NOTE — Patient Instructions (Signed)

## 2012-12-11 NOTE — Progress Notes (Signed)
Pulse: 130

## 2012-12-11 NOTE — Progress Notes (Signed)
HR 120s.  Asymptomatic. H/O tachycardia--w/u remarkable for anxiety per pt.

## 2012-12-20 ENCOUNTER — Encounter: Payer: Medicaid Other | Admitting: Obstetrics & Gynecology

## 2012-12-27 ENCOUNTER — Encounter: Payer: Medicaid Other | Admitting: Obstetrics & Gynecology

## 2013-01-17 ENCOUNTER — Encounter: Payer: Self-pay | Admitting: Obstetrics & Gynecology

## 2013-01-17 ENCOUNTER — Ambulatory Visit (INDEPENDENT_AMBULATORY_CARE_PROVIDER_SITE_OTHER): Payer: Medicaid Other | Admitting: Obstetrics & Gynecology

## 2013-01-17 VITALS — BP 146/88 | Temp 97.8°F | Wt 165.6 lb

## 2013-01-17 DIAGNOSIS — O0930 Supervision of pregnancy with insufficient antenatal care, unspecified trimester: Secondary | ICD-10-CM

## 2013-01-17 DIAGNOSIS — Z3483 Encounter for supervision of other normal pregnancy, third trimester: Secondary | ICD-10-CM

## 2013-01-17 DIAGNOSIS — Z3480 Encounter for supervision of other normal pregnancy, unspecified trimester: Secondary | ICD-10-CM

## 2013-01-17 LAB — POCT URINALYSIS DIPSTICK
Bilirubin, UA: NEGATIVE
Ketones, UA: NEGATIVE
Spec Grav, UA: 1.025
pH, UA: 5

## 2013-01-17 LAB — OB RESULTS CONSOLE GBS: GBS: NEGATIVE

## 2013-01-17 NOTE — Progress Notes (Signed)
Pulse- 132.  Manual B/P 126/84.

## 2013-01-17 NOTE — Patient Instructions (Signed)

## 2013-01-17 NOTE — Progress Notes (Signed)
Doing well 

## 2013-01-24 ENCOUNTER — Ambulatory Visit (INDEPENDENT_AMBULATORY_CARE_PROVIDER_SITE_OTHER): Payer: Medicaid Other | Admitting: Obstetrics & Gynecology

## 2013-01-24 VITALS — BP 138/85 | Temp 97.7°F | Wt 167.0 lb

## 2013-01-24 DIAGNOSIS — O480 Post-term pregnancy: Secondary | ICD-10-CM

## 2013-01-24 DIAGNOSIS — Z3480 Encounter for supervision of other normal pregnancy, unspecified trimester: Secondary | ICD-10-CM

## 2013-01-24 DIAGNOSIS — Z3483 Encounter for supervision of other normal pregnancy, third trimester: Secondary | ICD-10-CM

## 2013-01-24 LAB — POCT URINALYSIS DIPSTICK
Glucose, UA: NEGATIVE
Ketones, UA: NEGATIVE
Spec Grav, UA: 1.015

## 2013-01-24 NOTE — Progress Notes (Signed)
Pulse-102 Pt c/o constant lower left back pain x 4 to 5 days with difficulty lifting legs when laying down.

## 2013-01-25 ENCOUNTER — Other Ambulatory Visit: Payer: Self-pay | Admitting: Obstetrics & Gynecology

## 2013-01-25 ENCOUNTER — Encounter (HOSPITAL_COMMUNITY): Payer: Self-pay | Admitting: *Deleted

## 2013-01-25 ENCOUNTER — Ambulatory Visit (HOSPITAL_COMMUNITY)
Admission: RE | Admit: 2013-01-25 | Discharge: 2013-01-25 | Disposition: A | Discharge status: Home / Self Care | Payer: Medicaid Other | Source: Ambulatory Visit | Attending: Obstetrics & Gynecology | Admitting: Obstetrics & Gynecology

## 2013-01-25 ENCOUNTER — Other Ambulatory Visit: Payer: Self-pay | Admitting: *Deleted

## 2013-01-25 ENCOUNTER — Telehealth (HOSPITAL_COMMUNITY): Payer: Self-pay | Admitting: *Deleted

## 2013-01-25 DIAGNOSIS — Z3689 Encounter for other specified antenatal screening: Secondary | ICD-10-CM | POA: Insufficient documentation

## 2013-01-25 DIAGNOSIS — O480 Post-term pregnancy: Secondary | ICD-10-CM | POA: Insufficient documentation

## 2013-01-25 NOTE — Patient Instructions (Signed)
Labor Induction  Most women go into labor on their own between 37 and 42 weeks of the pregnancy. When this does not happen or when there is a medical need, medicine or other methods may be used to induce labor. Labor induction causes a pregnant woman's uterus to contract. It also causes the cervix to soften (ripen), open (dilate), and thin out (efface). Usually, labor is not induced before 39 weeks of the pregnancy unless there is a problem with the baby or mother. Whether your labor will be induced depends on a number of factors, including the following:  The medical condition of you and the baby.  How many weeks along you are.  The status of baby's lung maturity.  The condition of the cervix.  The position of the baby. REASONS FOR LABOR INDUCTION  The health of the baby or mother is at risk.  The pregnancy is overdue by 1 week or more.  The water breaks but labor does not start on its own.  The mother has a health condition or serious illness such as high blood pressure, infection, placental abruption, or diabetes.  The amniotic fluid amounts are low around the baby.  The baby is distressed. REASONS TO NOT INDUCE LABOR Labor induction may not be a good idea if:  It is shown that your baby does not tolerate labor.  An induction is just more convenient.  You want the baby to be born on a certain date, like a holiday.  You have had previous surgeries on your uterus, such as a myomectomy or the removal of fibroids.  Your placenta lies very low in the uterus and blocks the opening of the cervix (placenta previa).  Your baby is not in a head down position.  The umbilical cord drops down into the birth canal in front of the baby. This could cut off the baby's blood and oxygen supply.  You have had a previous cesarean delivery.  There areunusual circumstances, such as the baby being extremely premature. RISKS AND COMPLICATIONS Problems may occur in the process of induction  and plans may need to be modified as a situation unfolds. Some of the risks of induction include:  Change in fetal heart rate, such as too high, too low, or erratic.  Risk of fetal distress.  Risk of infection to mother and baby.  Increased chance of having a cesarean delivery.  The rare, but increased chance that the placenta will separate from the uterus (abruption).  Uterine rupture (very rare). When induction is needed for medical reasons, the benefits of induction may outweigh the risks. BEFORE THE PROCEDURE Your caregiver will check your cervix and the baby's position. This will help your caregiver decide if you are far enough along for an induction to work. PROCEDURE Several methods of labor induction may be used, such as:   Taking prostaglandin medicine to dilate and ripen the cervix. The medicine will also start contractions. It can be taken by mouth or by inserting a suppository into the vagina.  A thin tube (catheter) with a balloon on the end may be inserted into your vagina to dilate the cervix. Once inserted, the balloon expands with water, which causes the cervix to open.  Striping the membranes. Your caregiver inserts a finger between the cervix and membranes, which causes the cervix to be stretched and may cause the uterus to contract. This is often done during an office visit. You will be sent home to wait for the contractions to begin. You will   then come in for an induction.  Breaking the water. Your caregiver will make a hole in the amniotic sac using a small instrument. Once the amniotic sac breaks, contractions should begin. This may still take hours to see an effect.  Taking medicine to trigger or strengthen contractions. This medicine is given intravenously through a tube in your arm. All of the methods of induction, besides stripping the membranes, will be done in the hospital. Induction is done in the hospital so that you and the baby can be carefully  monitored. AFTER THE PROCEDURE Some inductions can take up to 2 or 3 days. Depending on the cervix, it usually takes less time. It takes longer when you are induced early in the pregnancy or if this is your first pregnancy. If a mother is still pregnant and the induction has been going on for 2 to 3 days, either the mother will be sent home or a cesarean delivery will be needed. Document Released: 11/03/2006 Document Revised: 09/06/2011 Document Reviewed: 04/19/2011 Exit[employer] Patient Information 2014 [employer].  

## 2013-01-25 NOTE — Progress Notes (Signed)
Doing well 

## 2013-01-25 NOTE — Telephone Encounter (Signed)
Preadmission screen  

## 2013-01-30 ENCOUNTER — Inpatient Hospital Stay (HOSPITAL_COMMUNITY)
Admission: RE | Admit: 2013-01-30 | Discharge: 2013-02-01 | DRG: 775 | Disposition: A | Discharge status: Home / Self Care | Payer: Medicaid Other | Source: Ambulatory Visit | Attending: Obstetrics & Gynecology | Admitting: Obstetrics & Gynecology

## 2013-01-30 ENCOUNTER — Inpatient Hospital Stay (HOSPITAL_COMMUNITY): Payer: Medicaid Other | Admitting: Anesthesiology

## 2013-01-30 ENCOUNTER — Encounter (HOSPITAL_COMMUNITY): Payer: Self-pay | Admitting: Anesthesiology

## 2013-01-30 ENCOUNTER — Encounter (HOSPITAL_COMMUNITY): Payer: Self-pay

## 2013-01-30 DIAGNOSIS — F17200 Nicotine dependence, unspecified, uncomplicated: Secondary | ICD-10-CM

## 2013-01-30 DIAGNOSIS — O480 Post-term pregnancy: Principal | ICD-10-CM | POA: Diagnosis present

## 2013-01-30 DIAGNOSIS — O99334 Smoking (tobacco) complicating childbirth: Secondary | ICD-10-CM | POA: Diagnosis present

## 2013-01-30 DIAGNOSIS — E162 Hypoglycemia, unspecified: Secondary | ICD-10-CM

## 2013-01-30 HISTORY — DX: Headache: R51

## 2013-01-30 LAB — CBC
Platelets: 199 10*3/uL (ref 150–400)
RBC: 4.07 MIL/uL (ref 3.87–5.11)
RDW: 14.1 % (ref 11.5–15.5)
WBC: 13.1 10*3/uL — ABNORMAL HIGH (ref 4.0–10.5)

## 2013-01-30 LAB — RPR: RPR Ser Ql: NONREACTIVE

## 2013-01-30 MED ORDER — ONDANSETRON HCL 4 MG/2ML IJ SOLN
4.0000 mg | Freq: Four times a day (QID) | INTRAMUSCULAR | Status: DC | PRN
  Filled 2013-01-30: qty 2

## 2013-01-30 MED ORDER — OXYTOCIN 40 UNITS IN LACTATED RINGERS INFUSION - SIMPLE MED
62.5000 mL/h | INTRAVENOUS | Status: DC
  Filled 2013-01-30: qty 1000

## 2013-01-30 MED ORDER — LACTATED RINGERS IV SOLN: 500.0000 mL | INTRAVENOUS | Status: DC | PRN

## 2013-01-30 MED ORDER — EPHEDRINE 5 MG/ML INJ
10.0000 mg | INTRAVENOUS | Status: DC | PRN
  Filled 2013-01-30: qty 2

## 2013-01-30 MED ORDER — CITRIC ACID-SODIUM CITRATE 334-500 MG/5ML PO SOLN: 30.0000 mL | ORAL | Status: DC | PRN

## 2013-01-30 MED ORDER — DIPHENHYDRAMINE HCL 50 MG/ML IJ SOLN: 12.5000 mg | INTRAMUSCULAR | Status: DC | PRN

## 2013-01-30 MED ORDER — BUTORPHANOL TARTRATE 1 MG/ML IJ SOLN: 1.0000 mg | INTRAMUSCULAR | Status: DC | PRN

## 2013-01-30 MED ORDER — OXYCODONE-ACETAMINOPHEN 5-325 MG PO TABS: 1.0000 | ORAL_TABLET | ORAL | Status: DC | PRN

## 2013-01-30 MED ORDER — HYDROXYZINE HCL 50 MG PO TABS
50.0000 mg | ORAL_TABLET | Freq: Four times a day (QID) | ORAL | Status: DC | PRN
  Filled 2013-01-30: qty 1

## 2013-01-30 MED ORDER — FENTANYL 2.5 MCG/ML BUPIVACAINE 1/10 % EPIDURAL INFUSION (WH - ANES)
INTRAMUSCULAR | Status: DC | PRN
  Administered 2013-01-30: 14 mL/h via EPIDURAL

## 2013-01-30 MED ORDER — EPHEDRINE 5 MG/ML INJ
10.0000 mg | INTRAVENOUS | Status: DC | PRN
  Filled 2013-01-30: qty 2
  Filled 2013-01-30: qty 4

## 2013-01-30 MED ORDER — LIDOCAINE HCL (PF) 1 % IJ SOLN
30.0000 mL | INTRAMUSCULAR | Status: DC | PRN
  Administered 2013-01-31: 30 mL via SUBCUTANEOUS
  Filled 2013-01-30 (×2): qty 30

## 2013-01-30 MED ORDER — OXYTOCIN BOLUS FROM INFUSION
500.0000 mL | INTRAVENOUS | Status: DC
  Administered 2013-01-31: 500 mL via INTRAVENOUS

## 2013-01-30 MED ORDER — PHENYLEPHRINE 40 MCG/ML (10ML) SYRINGE FOR IV PUSH (FOR BLOOD PRESSURE SUPPORT)
80.0000 ug | PREFILLED_SYRINGE | INTRAVENOUS | Status: DC | PRN
  Filled 2013-01-30: qty 5
  Filled 2013-01-30: qty 2

## 2013-01-30 MED ORDER — LACTATED RINGERS IV SOLN: 500.0000 mL | Freq: Once | INTRAVENOUS | Status: DC

## 2013-01-30 MED ORDER — ACETAMINOPHEN 325 MG PO TABS: 650.0000 mg | ORAL_TABLET | ORAL | Status: DC | PRN

## 2013-01-30 MED ORDER — LIDOCAINE HCL (PF) 1 % IJ SOLN
INTRAMUSCULAR | Status: DC | PRN
  Administered 2013-01-30 (×2): 9 mL

## 2013-01-30 MED ORDER — TERBUTALINE SULFATE 1 MG/ML IJ SOLN: 0.2500 mg | Freq: Once | INTRAMUSCULAR | Status: AC | PRN

## 2013-01-30 MED ORDER — FENTANYL 2.5 MCG/ML BUPIVACAINE 1/10 % EPIDURAL INFUSION (WH - ANES)
14.0000 mL/h | INTRAMUSCULAR | Status: DC | PRN
  Filled 2013-01-30: qty 125

## 2013-01-30 MED ORDER — MISOPROSTOL 25 MCG QUARTER TABLET
25.0000 ug | ORAL_TABLET | ORAL | Status: DC | PRN
  Administered 2013-01-30 (×2): 25 ug via VAGINAL
  Filled 2013-01-30 (×2): qty 0.25
  Filled 2013-01-30: qty 1

## 2013-01-30 MED ORDER — LACTATED RINGERS IV SOLN
INTRAVENOUS | Status: DC
  Administered 2013-01-30 (×2): via INTRAVENOUS

## 2013-01-30 MED ORDER — PHENYLEPHRINE 40 MCG/ML (10ML) SYRINGE FOR IV PUSH (FOR BLOOD PRESSURE SUPPORT)
80.0000 ug | PREFILLED_SYRINGE | INTRAVENOUS | Status: DC | PRN
  Filled 2013-01-30: qty 2

## 2013-01-30 NOTE — Anesthesia Preprocedure Evaluation (Signed)
Anesthesia Evaluation  Patient identified by MRN, date of birth, ID band Patient awake    Reviewed: Allergy & Precautions, H&P , Patient's Chart, lab work & pertinent test results  Airway Mallampati: I TM Distance: >3 FB Neck ROM: full    Dental no notable dental hx.    Pulmonary neg pulmonary ROS,  breath sounds clear to auscultation  Pulmonary exam normal       Cardiovascular negative cardio ROS      Neuro/Psych    GI/Hepatic negative GI ROS, Neg liver ROS,   Endo/Other  negative endocrine ROS  Renal/GU negative Renal ROS  negative genitourinary   Musculoskeletal negative musculoskeletal ROS (+)   Abdominal Normal abdominal exam  (+)   Peds  Hematology negative hematology ROS (+)   Anesthesia Other Findings   Reproductive/Obstetrics (+) Pregnancy                           Anesthesia Physical Anesthesia Plan  ASA: II  Anesthesia Plan: Epidural   Post-op Pain Management:    Induction:   Airway Management Planned:   Additional Equipment:   Intra-op Plan:   Post-operative Plan:   Informed Consent: I have reviewed the patients History and Physical, chart, labs and discussed the procedure including the risks, benefits and alternatives for the proposed anesthesia with the patient or authorized representative who has indicated his/her understanding and acceptance.     Plan Discussed with:   Anesthesia Plan Comments:         Anesthesia Quick Evaluation

## 2013-01-30 NOTE — H&P (Signed)
Carolyn [name] is a 32 y.o. female presenting for IOL postdates. Maternal Medical History:  Fetal activity: Perceived fetal activity is normal.    Prenatal complications: No bleeding, infection or pre-eclampsia.   Prenatal Complications - Diabetes: none.    OB History   Grav Para Term Preterm Abortions TAB SAB Ect Mult Living   2 1 1       1      Past Medical History  Diagnosis Date  . History of chicken pox   . No pertinent past medical history   . ZOXWRUEA(540.9)    Past Surgical History  Procedure Laterality Date  . No past surgeries    . Myringotomy     Family History: family history includes Cancer in her mother; Early death in her father; and Heart disease in her brother.  There is no history of Anesthesia problems. Social History:  reports that she has been smoking Cigarettes.  She has a 1.3 pack-year smoking history. She has never used smokeless tobacco. She reports that she does not drink alcohol or use illicit drugs.   Prenatal Transfer Tool  Maternal Diabetes: No Genetic Screening: Normal Maternal Ultrasounds/Referrals: Normal Fetal Ultrasounds or other Referrals:  None Maternal Substance Abuse:  No Significant Maternal Medications:  None Significant Maternal Lab Results:  None Other Comments:  None  Review of Systems  Constitutional: Negative.   HENT: Negative.   Eyes: Negative.   Respiratory: Negative.   Cardiovascular: Negative.   Gastrointestinal: Negative.   Genitourinary: Negative.   Musculoskeletal: Negative.   Skin: Negative.   Neurological: Negative.   Endo/Heme/Allergies: Negative.   Psychiatric/Behavioral: Negative.     Dilation: 1 Effacement (%): 50 Station: -2 Exam by:: Raliegh Ip RN Blood pressure 118/80, pulse 95, temperature 98.1 F (36.7 C), temperature source Oral, resp. rate 20, height 5\' 5"  (1.651 m), weight 167 lb (75.751 kg), last menstrual period 04/15/2012. Maternal Exam:  Uterine Assessment: Contraction strength is  mild.  Contraction frequency is rare.   Abdomen: Patient reports no abdominal tenderness. Fetal presentation: vertex  Introitus: Normal vulva. Normal vagina.  Vagina is negative for discharge.  Ferning test: not done.  Nitrazine test: not done.  Pelvis: adequate for delivery.   Cervix: Cervix evaluated by digital exam.     Physical Exam  Constitutional: She is oriented to person, place, and time. She appears well-developed and well-nourished.  HENT:  Head: Normocephalic.  Neck: Normal range of motion.  Cardiovascular: Normal rate, regular rhythm and normal heart sounds.   Respiratory: Effort normal and breath sounds normal.  GI: Soft. Bowel sounds are normal.  Genitourinary: Vagina normal and uterus normal. No vaginal discharge found.  Musculoskeletal: Normal range of motion.  Neurological: She is alert and oriented to person, place, and time.  Skin: Skin is warm and dry.  Psychiatric: She has a normal mood and affect. Her behavior is normal. Judgment and thought content normal.    Prenatal labs: ABO, Rh:  A+ Antibody:  Negative Rubella:  Immune  RPR: Nonreactive (02/27 0000)  HBsAg:   Non-reactive HIV: Non-reactive (02/27 0000)  GBS: Negative (07/23 0000)   Assessment/Plan: Admit to L&D Category 1 FHR Start IOL, cytotec placed #1 Anticipate labor and NSVD. Consult with MD Clearance Coots PRN. Epidural PRN   Jaion Lagrange 01/30/2013, 9:26 AM

## 2013-01-30 NOTE — Anesthesia Procedure Notes (Signed)
Epidural Patient location during procedure: OB Start time: 01/30/2013 7:40 PM End time: 01/30/2013 7:44 PM  Staffing Anesthesiologist: Sandrea Hughs Performed by: anesthesiologist   Preanesthetic Checklist Completed: patient identified, site marked, surgical consent, pre-op evaluation, timeout performed, IV checked, risks and benefits discussed and monitors and equipment checked  Epidural Patient position: sitting Prep: site prepped and draped and DuraPrep Patient monitoring: continuous pulse ox and blood pressure Approach: midline Injection technique: LOR air  Needle:  Needle type: Tuohy  Needle gauge: 17 G Needle length: 9 cm and 9 Needle insertion depth: 5 cm cm Catheter type: closed end flexible Catheter size: 19 Gauge Catheter at skin depth: 10 cm Test dose: negative and Other  Assessment Sensory level: T9 Events: blood not aspirated, injection not painful, no injection resistance, negative IV test and no paresthesia

## 2013-01-30 NOTE — Progress Notes (Addendum)
Carolyn [name] is a 32 y.o. G2P1001 at [redacted]w[redacted]d by LMP admitted for induction of labor due to Post dates. Due date 01/20/2013 by LMP.  Subjective:   Denies concerns. Irregular contractions. Appears comfortable.  Objective: BP 117/79  Pulse 84  Temp(Src) 98.6 F (37 C) (Oral)  Resp 20  Ht 5\' 5"  (1.651 m)  Wt 167 lb (75.751 kg)  BMI 27.79 kg/m2  LMP 04/15/2012    Abdomen soft, mild to palpation.  FHT:  FHR: 130 bpm, variability: moderate,  accelerations:  Present,  decelerations:  Absent UC:   none SVE:   Dilation: 2 Effacement (%): 50 Station: -2 Exam by:: Carolyn [name] CNM  Labs: Lab Results  Component Value Date   WBC 13.1* 01/30/2013   HGB 12.8 01/30/2013   HCT 37.9 01/30/2013   MCV 93.1 01/30/2013   PLT 199 01/30/2013    Assessment / Plan: Induction of labor due to postterm,  progressing well on cytotec Cytotec #2 placed  Labor: Progressing normally Preeclampsia:  no Fetal Wellbeing:  Category I Pain Control:  Labor support without medications I/D:  n/a Anticipated MOD:  NSVD  Carolyn [name] 01/30/2013, 1:24 PM

## 2013-01-30 NOTE — Progress Notes (Signed)
Carolyn [name] is a 32 y.o. G2P1001 at [redacted]w[redacted]d by LMP admitted for induction of labor due to Post dates. Due date 01-20-13.  Subjective:   Objective: BP 100/80  Pulse 88  Temp(Src) 98.2 F (36.8 C) (Oral)  Resp 20  Ht 5\' 5"  (1.651 m)  Wt 167 lb (75.751 kg)  BMI 27.79 kg/m2  LMP 04/15/2012      FHT:  FHR: 150's bpm, variability: moderate,  accelerations:  Present,  decelerations:  Absent UC:   regular, every 3 minutes SVE:   Dilation: 10 Effacement (%): 100 Station: +1 Exam by:: c soliz rn  Labs: Lab Results  Component Value Date   WBC 13.1* 01/30/2013   HGB 12.8 01/30/2013   HCT 37.9 01/30/2013   MCV 93.1 01/30/2013   PLT 199 01/30/2013    Assessment / Plan: Induction of labor due to postterm,  progressing well on pitocin  Labor: Progressing normally Preeclampsia:  n/a Fetal Wellbeing:  Category I Pain Control:  Epidural I/D:  n/a Anticipated MOD:  NSVD  HARPER,CHARLES A 01/30/2013, 10:40 PM

## 2013-01-30 NOTE — Progress Notes (Signed)
Carolyn Carolyn [name] is Carolyn [name] 32 y.o. G2P1001 at [redacted]w[redacted]d by LMP admitted for induction of labor due to Post dates. Due date 01-20-13.  Subjective:   Objective: BP 129/92  Pulse 68  Temp(Src) 98.2 F (36.8 C) (Oral)  Resp 20  Ht 5\' 5"  (1.651 m)  Wt 167 lb (75.751 kg)  BMI 27.79 kg/m2  LMP 04/15/2012      FHT:  FHR: 150-160 bpm, variability: moderate,  accelerations:  Present,  decelerations:  Absent UC:   regular, every 3-4 minutes SVE:   Dilation: 4 Effacement (%): 70 Station: -2 Exam by:: Carolyn Ip RN  Labs: Lab Results  Component Value Date   WBC 13.1* 01/30/2013   HGB 12.8 01/30/2013   HCT 37.9 01/30/2013   MCV 93.1 01/30/2013   PLT 199 01/30/2013    Assessment / Plan: Induction of labor due to postterm,  progressing well on pitocin  Labor: Progressing normally Preeclampsia:  n/Carolyn [name] Fetal Wellbeing:  Category I Pain Control:  Epidural I/D:  n/Carolyn [name] Anticipated MOD:  NSVD  Carolyn Carolyn [name] 01/30/2013, 7:33 PM

## 2013-01-31 ENCOUNTER — Encounter (HOSPITAL_COMMUNITY): Payer: Self-pay

## 2013-01-31 LAB — CBC
HCT: 35.2 % — ABNORMAL LOW (ref 36.0–46.0)
Hemoglobin: 11.8 g/dL — ABNORMAL LOW (ref 12.0–15.0)
MCH: 31.3 pg (ref 26.0–34.0)
RBC: 3.77 MIL/uL — ABNORMAL LOW (ref 3.87–5.11)

## 2013-01-31 MED ORDER — ACETAMINOPHEN 325 MG PO TABS
650.0000 mg | ORAL_TABLET | ORAL | Status: DC | PRN
  Administered 2013-01-31 (×2): 650 mg via ORAL
  Filled 2013-01-31 (×2): qty 2

## 2013-01-31 MED ORDER — ZOLPIDEM TARTRATE 5 MG PO TABS: 5.0000 mg | ORAL_TABLET | Freq: Every evening | ORAL | Status: DC | PRN

## 2013-01-31 MED ORDER — PRENATAL MULTIVITAMIN CH
1.0000 | ORAL_TABLET | Freq: Every day | ORAL | Status: DC
  Administered 2013-01-31: 1 via ORAL
  Filled 2013-01-31: qty 1

## 2013-01-31 MED ORDER — SIMETHICONE 80 MG PO CHEW: 80.0000 mg | CHEWABLE_TABLET | ORAL | Status: DC | PRN

## 2013-01-31 MED ORDER — OXYTOCIN 40 UNITS IN LACTATED RINGERS INFUSION - SIMPLE MED: 62.5000 mL/h | INTRAVENOUS | Status: DC | PRN

## 2013-01-31 MED ORDER — DIBUCAINE 1 % RE OINT: 1.0000 "application " | TOPICAL_OINTMENT | RECTAL | Status: DC | PRN

## 2013-01-31 MED ORDER — IBUPROFEN 600 MG PO TABS
600.0000 mg | ORAL_TABLET | Freq: Four times a day (QID) | ORAL | Status: DC | PRN
  Administered 2013-01-31 – 2013-02-01 (×4): 600 mg via ORAL
  Filled 2013-01-31 (×4): qty 1

## 2013-01-31 MED ORDER — ONDANSETRON HCL 4 MG/2ML IJ SOLN: 4.0000 mg | INTRAMUSCULAR | Status: DC | PRN

## 2013-01-31 MED ORDER — WITCH HAZEL-GLYCERIN EX PADS: 1.0000 "application " | MEDICATED_PAD | CUTANEOUS | Status: DC | PRN

## 2013-01-31 MED ORDER — ONDANSETRON HCL 4 MG PO TABS: 4.0000 mg | ORAL_TABLET | ORAL | Status: DC | PRN

## 2013-01-31 MED ORDER — OXYTOCIN 10 UNIT/ML IJ SOLN
10.0000 [IU] | Freq: Once | INTRAMUSCULAR | Status: AC
  Administered 2013-01-31: 10 [IU]

## 2013-01-31 MED ORDER — SENNOSIDES-DOCUSATE SODIUM 8.6-50 MG PO TABS
2.0000 | ORAL_TABLET | Freq: Every day | ORAL | Status: DC
  Administered 2013-01-31: 2 via ORAL

## 2013-01-31 MED ORDER — BENZOCAINE-MENTHOL 20-0.5 % EX AERO
1.0000 "application " | INHALATION_SPRAY | CUTANEOUS | Status: DC | PRN
  Administered 2013-01-31: 1 via TOPICAL
  Filled 2013-01-31: qty 56

## 2013-01-31 MED ORDER — TETANUS-DIPHTH-ACELL PERTUSSIS 5-2.5-18.5 LF-MCG/0.5 IM SUSP: 0.5000 mL | Freq: Once | INTRAMUSCULAR | Status: DC

## 2013-01-31 MED ORDER — DIPHENHYDRAMINE HCL 25 MG PO CAPS: 25.0000 mg | ORAL_CAPSULE | Freq: Four times a day (QID) | ORAL | Status: DC | PRN

## 2013-01-31 MED ORDER — OXYCODONE-ACETAMINOPHEN 5-325 MG PO TABS: 1.0000 | ORAL_TABLET | ORAL | Status: DC | PRN

## 2013-01-31 MED ORDER — LANOLIN HYDROUS EX OINT: TOPICAL_OINTMENT | CUTANEOUS | Status: DC | PRN

## 2013-01-31 MED ORDER — OXYTOCIN 10 UNIT/ML IJ SOLN
INTRAMUSCULAR | Status: AC
  Filled 2013-01-31: qty 1

## 2013-01-31 MED ORDER — PNEUMOCOCCAL VAC POLYVALENT 25 MCG/0.5ML IJ INJ
0.5000 mL | INJECTION | INTRAMUSCULAR | Status: DC
  Filled 2013-01-31: qty 0.5

## 2013-01-31 NOTE — Progress Notes (Signed)
UR chart review completed.  

## 2013-01-31 NOTE — Anesthesia Postprocedure Evaluation (Signed)
  Anesthesia Post-op Note  Patient: Geneticist, molecular  Procedure(s) Performed: * No procedures listed *  Patient Location: PACU and Mother/Baby  Anesthesia Type:Epidural  Level of Consciousness: awake, alert  and oriented  Airway and Oxygen Therapy: Patient Spontanous Breathing  Post-op Pain: none  Post-op Assessment: Post-op Vital signs reviewed, Patient's Cardiovascular Status Stable, No headache, No backache, No residual numbness and No residual motor weakness  Post-op Vital Signs: Reviewed and stable  Complications: No apparent anesthesia complications

## 2013-01-31 NOTE — Progress Notes (Signed)
Post Partum Day 0 Subjective: no complaints  Objective: Blood pressure 128/70, pulse 74, temperature 97.6 F (36.4 C), temperature source Axillary, resp. rate 18, height 5\' 5"  (1.651 m), weight 167 lb (75.751 kg), last menstrual period 04/15/2012, unknown if currently breastfeeding.  Physical Exam:  General: alert and no distress Lochia: appropriate Uterine Fundus: firm Incision: healing well DVT Evaluation: No evidence of DVT seen on physical exam.   Recent Labs  01/30/13 0800 01/31/13 0620  HGB 12.8 11.8*  HCT 37.9 35.2*    Assessment/Plan: Plan for discharge tomorrow   LOS: 1 day   HARPER,CHARLES A 01/31/2013, 8:59 AM

## 2013-01-31 NOTE — Progress Notes (Signed)
Patient was referred for history of depression/anxiety.  * Referral screened out by Clinical Social Worker because none of the following criteria appear to apply:  ~ History of anxiety/depression during this pregnancy, or of post-partum depression.  ~ Diagnosis of anxiety and/or depression within last 5-6 years, as per pt.  ~ History of depression due to pregnancy loss/loss of child  OR  * Patient's symptoms currently being treated with medication and/or therapy.  Please contact the Clinical Social Worker if needs arise, or by the patient's request.

## 2013-02-01 MED ORDER — IBUPROFEN 600 MG PO TABS: 600.0000 mg | ORAL_TABLET | Freq: Four times a day (QID) | ORAL | Status: AC | PRN

## 2013-02-01 MED ORDER — OXYCODONE-ACETAMINOPHEN 5-325 MG PO TABS
1.0000 | ORAL_TABLET | ORAL | Status: AC | PRN
Start: 2013-02-01 — End: ?

## 2013-02-01 NOTE — Progress Notes (Signed)
Post Partum Day 1.5 Subjective: no complaints  Objective: Blood pressure 121/84, pulse 74, temperature 98 F (36.7 C), temperature source Oral, resp. rate 19, height 5\' 5"  (1.651 m), weight 167 lb (75.751 kg), last menstrual period 04/15/2012, SpO2 99.00%, unknown if currently breastfeeding.  Physical Exam:  General: alert and no distress Lochia: appropriate Uterine Fundus: firm Incision: healing well DVT Evaluation: No evidence of DVT seen on physical exam.   Recent Labs  01/30/13 0800 01/31/13 0620  HGB 12.8 11.8*  HCT 37.9 35.2*    Assessment/Plan: Discharge home   LOS: 2 days   Carolyn [name] A 02/01/2013, 9:43 AM

## 2013-02-01 NOTE — Discharge Summary (Signed)
Obstetric Discharge Summary Reason for Admission: induction of labor Prenatal Procedures: NST and ultrasound Intrapartum Procedures: spontaneous vaginal delivery Postpartum Procedures: none Complications-Operative and Postpartum: none Hemoglobin  Date Value Range Status  01/31/2013 11.8* 12.0 - 15.0 g/dL Final  10/04/8117 14.7   Final     HCT  Date Value Range Status  01/31/2013 35.2* 36.0 - 46.0 % Final  08/24/2012 39   Final    Physical Exam:  General: alert and no distress Lochia: appropriate Uterine Fundus: firm Incision: None DVT Evaluation: No evidence of DVT seen on physical exam.  Discharge Diagnoses: Post-date pregnancy  Discharge Information: Date: 02/01/2013 Activity: pelvic rest Diet: routine Medications: PNV, Ibuprofen, Colace and Percocet Condition: stable Instructions: refer to practice specific booklet Discharge to: home Follow-up Information   Follow up with Carolyn Char A, MD. Schedule an appointment as soon as possible for Carolyn [name] visit in 2 weeks.   Contact information:   [address] [place] [address] [phone]       Newborn Data: Live born female  Birth Weight: 6 lb 4 oz (2835 g) APGAR: 8, 9  Home with mother.  Carolyn Carolyn [name] 02/01/2013, 9:51 AM

## 2013-02-14 ENCOUNTER — Ambulatory Visit: Payer: Medicaid Other | Admitting: Obstetrics

## 2013-03-01 ENCOUNTER — Ambulatory Visit: Payer: Medicaid Other | Admitting: Obstetrics & Gynecology

## 2013-03-05 ENCOUNTER — Ambulatory Visit: Payer: Medicaid Other | Admitting: Obstetrics & Gynecology

## 2013-03-19 ENCOUNTER — Encounter: Payer: Self-pay | Admitting: Obstetrics & Gynecology

## 2013-03-19 ENCOUNTER — Ambulatory Visit (INDEPENDENT_AMBULATORY_CARE_PROVIDER_SITE_OTHER): Payer: Medicaid Other | Admitting: Obstetrics & Gynecology

## 2013-03-19 VITALS — BP 118/79 | HR 92 | Temp 98.2°F | Ht 65.5 in | Wt 148.2 lb

## 2013-03-19 DIAGNOSIS — Z3202 Encounter for pregnancy test, result negative: Secondary | ICD-10-CM

## 2013-03-19 DIAGNOSIS — M533 Sacrococcygeal disorders, not elsewhere classified: Secondary | ICD-10-CM

## 2013-03-19 MED ORDER — NORGESTIM-ETH ESTRAD TRIPHASIC 0.18/0.215/0.25 MG-25 MCG PO TABS: 1.0000 | ORAL_TABLET | Freq: Every day | ORAL | Status: DC

## 2013-03-19 NOTE — Patient Instructions (Signed)
Intrauterine Device Information An intrauterine device (IUD) is inserted into your uterus and prevents pregnancy. There are 2 types of IUDs available:  Copper IUD. This type of IUD is wrapped in copper wire and is placed inside the uterus. Copper makes the uterus and fallopian tubes produce a fluid that kills sperm. The copper IUD can stay in place for 10 years.  Hormone IUD. This type of IUD contains the hormone progestin (synthetic progesterone). The hormone thickens the cervical mucus and prevents sperm from entering the uterus, and it also thins the uterine lining to prevent implantation of a fertilized egg. The hormone can weaken or kill the sperm that get into the uterus. The hormone IUD can stay in place for 5 years. Your caregiver will make sure you are a good candidate for a contraceptive IUD. Discuss with your caregiver the possible side effects. ADVANTAGES  It is highly effective, reversible, long-acting, and low maintenance.  There are no estrogen-related side effects.  An IUD can be used when breastfeeding.  It is not associated with weight gain.  It works immediately after insertion.  The copper IUD does not interfere with your female hormones.  The progesterone IUD can make heavy menstrual periods lighter.  The progesterone IUD can be used for 5 years.  The copper IUD can be used for 10 years. DISADVANTAGES  The progesterone IUD can be associated with irregular bleeding patterns.  The copper IUD can make your menstrual flow heavier and more painful.  You may experience cramping and vaginal bleeding after insertion. Document Released: 05/18/2004 Document Revised: 09/06/2011 Document Reviewed: 10/17/2010 [facility] Patient Information 2014 [place], [place]. Influenza Vaccine (Flu Vaccine, Inactivated) 2013 2014 What You Need to Know WHY GET VACCINATED?  Influenza ("flu") is a contagious disease that spreads around the [place] every winter, usually between  October and May.  Flu is caused by the influenza virus, and can be spread by coughing, sneezing, and close contact.  Anyone can get flu, but the risk of getting flu is highest among children. Symptoms come on suddenly and may last several days. They can include:  Fever or chills.  Sore throat.  Muscle aches.  Fatigue.  Cough.  Headache.  Runny or stuffy nose. Flu can make some people much sicker than others. These people include young children, people 34 and older, pregnant women, and people with certain health conditions such as heart, lung or kidney disease, or a weakened immune system. Flu vaccine is especially important for these people, and anyone in close contact with them. Flu can also lead to pneumonia, and make existing medical conditions worse. It can cause diarrhea and seizures in children. Each year thousands of people in the [place] die from flu, and many more are hospitalized. Flu vaccine is the best protection we have from flu and its complications. Flu vaccine also helps prevent spreading flu from person to person. INACTIVATED FLU VACCINE There are 2 types of influenza vaccine:  You are getting an inactivated flu vaccine, which does not contain any live influenza virus. It is given by injection with a needle, and often called the "flu shot."  A different live, attenuated (weakened) influenza vaccine is sprayed into the nostrils. This vaccine is described in a separate Vaccine Information Statement. Flu vaccine is recommended every year. Children 6 months through 3 years of age should get 2 doses the first year they get vaccinated. Flu viruses are always changing. Each year's flu vaccine is made to protect from viruses that are most  likely to cause disease that year. While flu vaccine cannot prevent all cases of flu, it is our best defense against the disease. Inactivated flu vaccine protects against 3 or 4 different influenza viruses. It takes about 2 weeks for  protection to develop after the vaccination, and protection lasts several months to a year. Some illnesses that are not caused by influenza virus are often mistaken for flu. Flu vaccine will not prevent these illnesses. It can only prevent influenza. A "high-dose" flu vaccine is available for people 34 years of age and older. The person giving you the vaccine can tell you more about it. Some inactivated flu vaccine contains a very small amount of a mercury-based preservative called thimerosal. Studies have shown that thimerosal in vaccines is not harmful, but flu vaccines that do not contain a preservative are available. SOME PEOPLE SHOULD NOT GET THIS VACCINE Tell the person who gives you the vaccine:  If you have any severe (life-threatening) allergies. If you ever had a life-threatening allergic reaction after a dose of flu vaccine, or have a severe allergy to any part of this vaccine, you may be advised not to get a dose. Most, but not all, types of flu vaccine contain a small amount of egg.  If you ever had Guillain Barr Syndrome (a severe paralyzing illness, also called GBS). Some people with a history of GBS should not get this vaccine. This should be discussed with your doctor.  If you are not feeling well. They might suggest waiting until you feel better. But you should come back. RISKS OF A VACCINE REACTION With a vaccine, like any medicine, there is a chance of side effects. These are usually mild and go away on their own. Serious side effects are also possible, but are very rare. Inactivated flu vaccine does not contain live flu virus, sogetting flu from this vaccine is not possible. Brief fainting spells and related symptoms (such as jerking movements) can happen after any medical procedure, including vaccination. Sitting or lying down for about 15 minutes after a vaccination can help prevent fainting and injuries caused by falls. Tell your doctor if you feel dizzy or lightheaded, or  have vision changes or ringing in the ears. Mild problems following inactivated flu vaccine:  Soreness, redness, or swelling where the shot was given.  Hoarseness; sore, red or itchy eyes; or cough.  Fever.  Aches.  Headache.  Itching.  Fatigue. If these problems occur, they usually begin soon after the shot and last 1 or 2 days. Moderate problems following inactivated flu vaccine:  Young children who get inactivated flu vaccine and pneumococcal vaccine (PCV13) at the same time may be at increased risk for seizures caused by fever. Ask your doctor for more information. Tell your doctor if a child who is getting flu vaccine has ever had a seizure. Severe problems following inactivated flu vaccine:  A severe allergic reaction could occur after any vaccine (estimated less than 1 in a million doses).  There is a small possibility that inactivated flu vaccine could be associated with Guillan Barr Syndrome (GBS), no more than 1 or 2 cases per million people vaccinated. This is much lower than the risk of severe complications from flu, which can be prevented by flu vaccine. The safety of vaccines is always being monitored. For more information, visit: http://floyd.org/ WHAT IF THERE IS A SERIOUS REACTION? What should I look for?  Look for anything that concerns you, such as signs of a severe allergic reaction, very high  fever, or behavior changes. Signs of a severe allergic reaction can include hives, swelling of the face and throat, difficulty breathing, a fast heartbeat, dizziness, and weakness. These would start a few minutes to a few hours after the vaccination. What should I do?  If you think it is a severe allergic reaction or other emergency that cannot wait, call 9 1 1  or get the person to the nearest hospital. Otherwise, call your doctor.  Afterward, the reaction should be reported to the Vaccine Adverse Event Reporting System (VAERS). Your doctor might file this  report, or you can do it yourself through the VAERS website at www.vaers.LAgents.no, or by calling 1-564-479-2950. VAERS is only for reporting reactions. They do not give medical advice. THE NATIONAL VACCINE INJURY COMPENSATION PROGRAM The National Vaccine Injury Compensation Program (VICP) is a federal program that was created to compensate people who may have been injured by certain vaccines. Persons who believe they may have been injured by a vaccine can learn about the program and about filing a claim by calling 1-873-134-1061 or visiting the VICP website at SpiritualWord.at HOW CAN I LEARN MORE?  Ask your doctor.  Call your local or state health department.  Contact the Centers for Disease Control and Prevention (CDC):  Call (516)283-8440 (1-800-CDC-INFO) or  Visit CDC's website at BiotechRoom.com.cy CDC Inactivated Influenza Vaccine Interim VIS (01/21/12) Document Released: 04/08/2006 Document Revised: 03/08/2012 Document Reviewed: 01/21/2012 [facility] Patient Information 2014 [place], [place].

## 2013-03-19 NOTE — Progress Notes (Signed)
.   Subjective:     Carolyn [name] is a 32 y.o. female who presents for a postpartum visit. She is 6 weeks postpartum following a spontaneous vaginal delivery. I have fully reviewed the prenatal and intrapartum course. The delivery was at 41 gestational weeks. Outcome: spontaneous vaginal delivery. Anesthesia: epidural. Postpartum course has been normal. Baby's course has been normal. Baby is feeding by bottle - gerber. Bleeding no bleeding. Bowel function is normal. Bladder function is normal. Patient is not sexually active. Contraception method is none. Postpartum depression screening: negative.  The following portions of the patient's history were reviewed and updated as appropriate: allergies, current medications, past family history, past medical history, past social history, past surgical history and problem list.  Review of Systems Pertinent items are noted in HPI.   Objective:    BP 118/79  Pulse 92  Temp(Src) 98.2 F (36.8 C) (Oral)  Ht 5' 5.5" (1.664 m)  Wt 148 lb 3.2 oz (67.223 kg)  BMI 24.28 kg/m2  Breastfeeding? No        General:  alert     Abdomen: soft, non-tender; bowel sounds normal; no masses,  no organomegaly   Vulva:  normal  Vagina: normal vagina  Cervix:  no lesions  Corpus: normal size, contour, position, consistency, mobility, non-tender  Adnexa:  normal adnexa   Assessment:   Coccydynia  Plan:   Plans COCP/considering a LARC Return in 6 weeks for Pap/exam of coccyx

## 2013-05-02 ENCOUNTER — Ambulatory Visit: Payer: Self-pay | Admitting: Obstetrics & Gynecology

## 2013-05-03 ENCOUNTER — Ambulatory Visit: Payer: Self-pay | Admitting: Obstetrics & Gynecology

## 2014-02-04 ENCOUNTER — Telehealth: Payer: Self-pay | Admitting: *Deleted

## 2014-02-04 NOTE — Telephone Encounter (Signed)
Pt placed call to office regarding refill on birth control.  In review of chart, pt will need AEX. Return call to pt. No answer, LM on VM to contact office.

## 2014-02-05 ENCOUNTER — Other Ambulatory Visit: Payer: Self-pay | Admitting: *Deleted

## 2014-02-05 MED ORDER — NORGESTIM-ETH ESTRAD TRIPHASIC 0.18/0.215/0.25 MG-25 MCG PO TABS: 1.0000 | ORAL_TABLET | Freq: Every day | ORAL | Status: AC

## 2014-02-05 NOTE — Progress Notes (Signed)
Pt called to office requesting refill on birth control.  In review of chart, pt is due for AEX. Return call to pt making her aware that refill could be sent with 3 refills and that she is to schedule an annual exam.  Pt states that she is awaiting insurance and will call to schedule when insurance active.

## 2014-02-05 NOTE — Telephone Encounter (Signed)
Spoke with pt making her aware of need for appt.  Pt states she is awaiting insurance with her employer.  Refill sent to pharmacy with 3 refills in order for pt get scheduled for annual.

## 2014-04-16 ENCOUNTER — Telehealth: Payer: Self-pay | Admitting: *Deleted

## 2014-04-16 NOTE — Telephone Encounter (Signed)
Pt called to office to refill birth control due to no insurance for annual exam at this time. Pt had not filled BC that was previously sent.  Pt made aware that Ortho Tri Cycle Lo 1pack with 1 refill could be sent in order for her to make AEX once insurance is obtained.  Pt to be made aware no further refills after this Rx.

## 2014-04-29 ENCOUNTER — Encounter: Payer: Self-pay | Admitting: Obstetrics & Gynecology

## 2014-06-24 ENCOUNTER — Encounter: Payer: Self-pay | Admitting: *Deleted

## 2014-06-25 ENCOUNTER — Encounter: Payer: Self-pay | Admitting: Obstetrics & Gynecology

## 2014-08-22 IMAGING — US US FETAL BPP W/O NONSTRESS
1 series · 7 of 7 positions shown · non-contrast
Comparison: none

[Series 1: us fetal bpp w/o nonstress · non-contrast · 7 acquisitions, 7 frames shown]
[im 1/7]
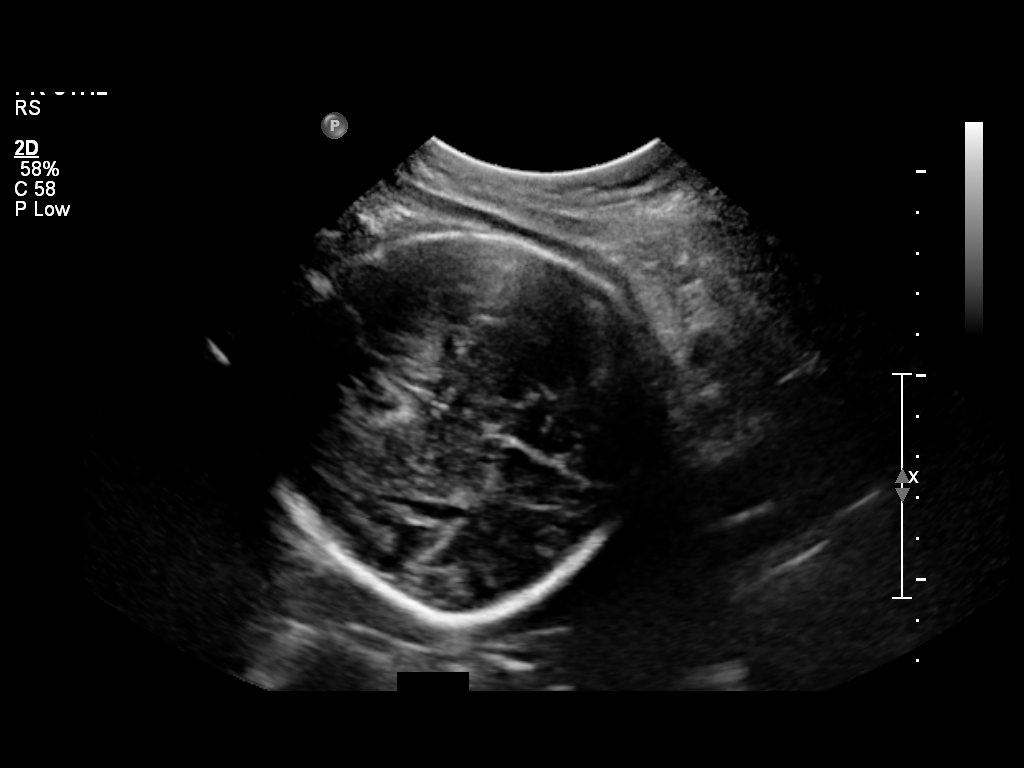
[im 2/7]
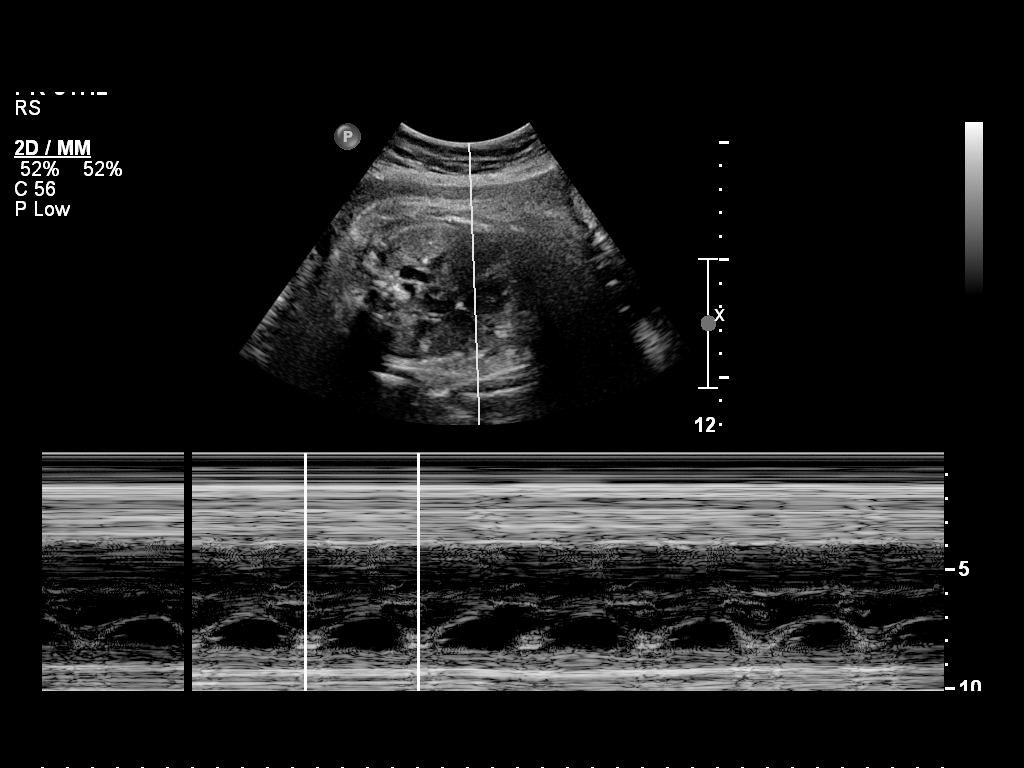
[im 3/7]
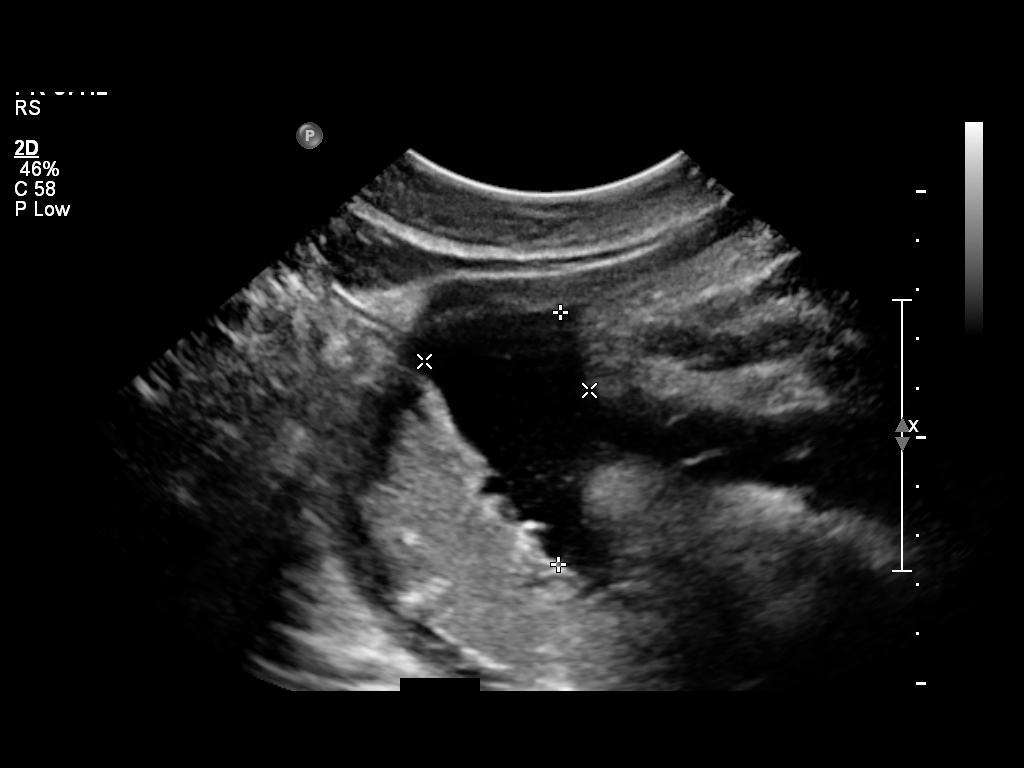
[im 4/7]
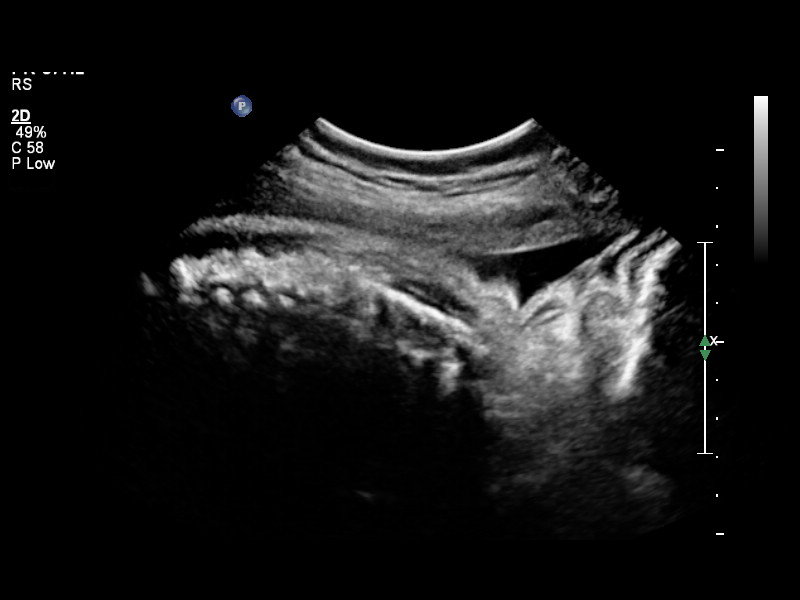
[im 5/7]
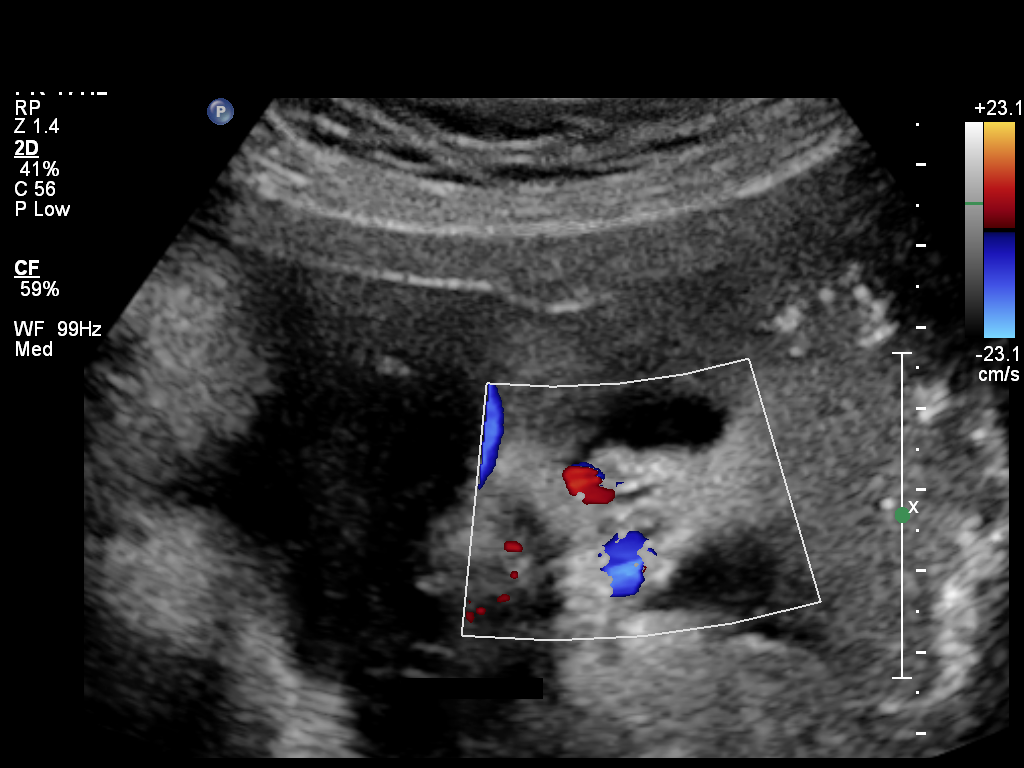
[im 6/7]
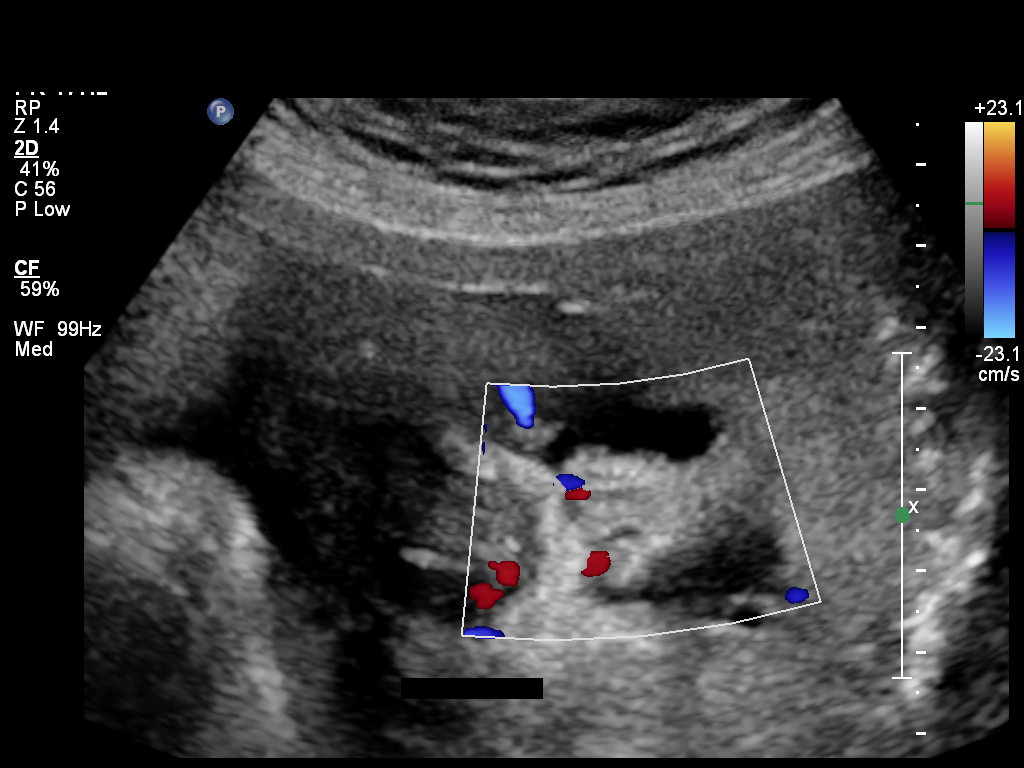
[im 7/7]
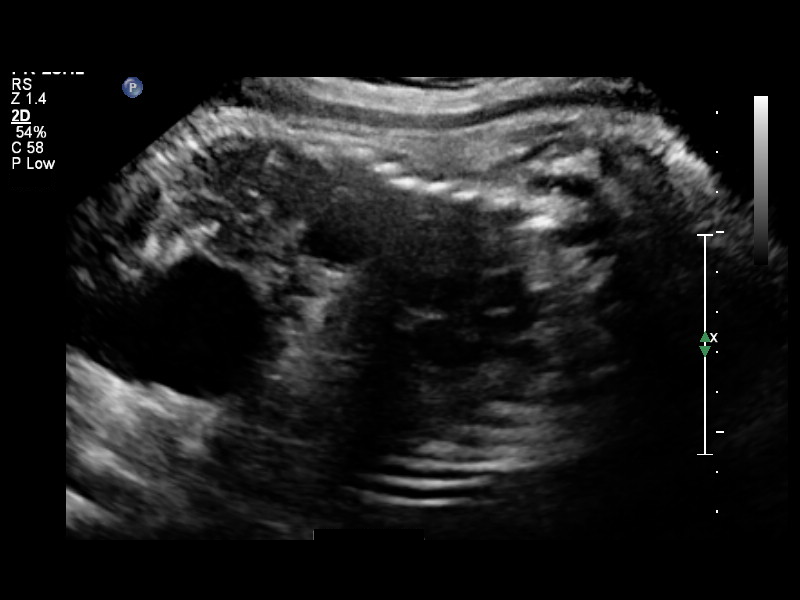

[7 of 7 positions shown; findings below may reference images not displayed]

OBSTETRICS REPORT
                      (Signed Final 01/25/2013 [DATE])

Service(s) Provided

Indications

 Postdate pregnancy (40-42 weeks)
Fetal Evaluation

 Num Of Fetuses:    1
 Fetal Heart Rate:  133                         bpm
 Cardiac Activity:  Observed
 Presentation:      Cephalic
 Placenta:          Posterior, above cervical
                    os

 Amniotic Fluid
 AFI FV:      Subjectively within normal limits
                                             Larg Pckt:     5.1  cm
Biophysical Evaluation

 Amniotic F.V:   Within normal limits       F. Tone:        Observed
 F. Movement:    Observed                   Score:          [DATE]
 F. Breathing:   Observed
Gestational Age

 Clinical EDD:  40w 5d                                        EDD:   01/20/13
 Best:          40w 5d    Det. By:   Clinical EDD             EDD:   01/20/13
Impression

 BPP [DATE].

 Subjectively normal amniotic fluid volume with a single pocket
 measurement of > 2 x 2 cm noted.

 questions or concerns.
